# Patient Record
Sex: Male | Born: 2000 | Race: Black or African American | Hispanic: No | Marital: Single | Smoking: Never smoker
Health system: Southern US, Community
[De-identification: ages and names within clinical notes are randomized; demographics above are authoritative.]

## PROBLEM LIST (undated history)

## (undated) DIAGNOSIS — I4891 Unspecified atrial fibrillation: Secondary | ICD-10-CM

## (undated) DIAGNOSIS — Q201 Double outlet right ventricle: Secondary | ICD-10-CM

## (undated) DIAGNOSIS — Q203 Discordant ventriculoarterial connection: Secondary | ICD-10-CM

## (undated) DIAGNOSIS — Q210 Ventricular septal defect: Secondary | ICD-10-CM

## (undated) HISTORY — PX: EYE SURGERY: SHX253

## (undated) HISTORY — PX: CARDIAC CATHETERIZATION: SHX172

---
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
## 2022-04-16 IMAGING — DX DG CHEST 1V PORT
1 series · 1 of 1 positions shown · non-contrast
Comparison: None.

CLINICAL DATA: Pain.

EXAM:
PORTABLE CHEST 1 VIEW

[chest ap]
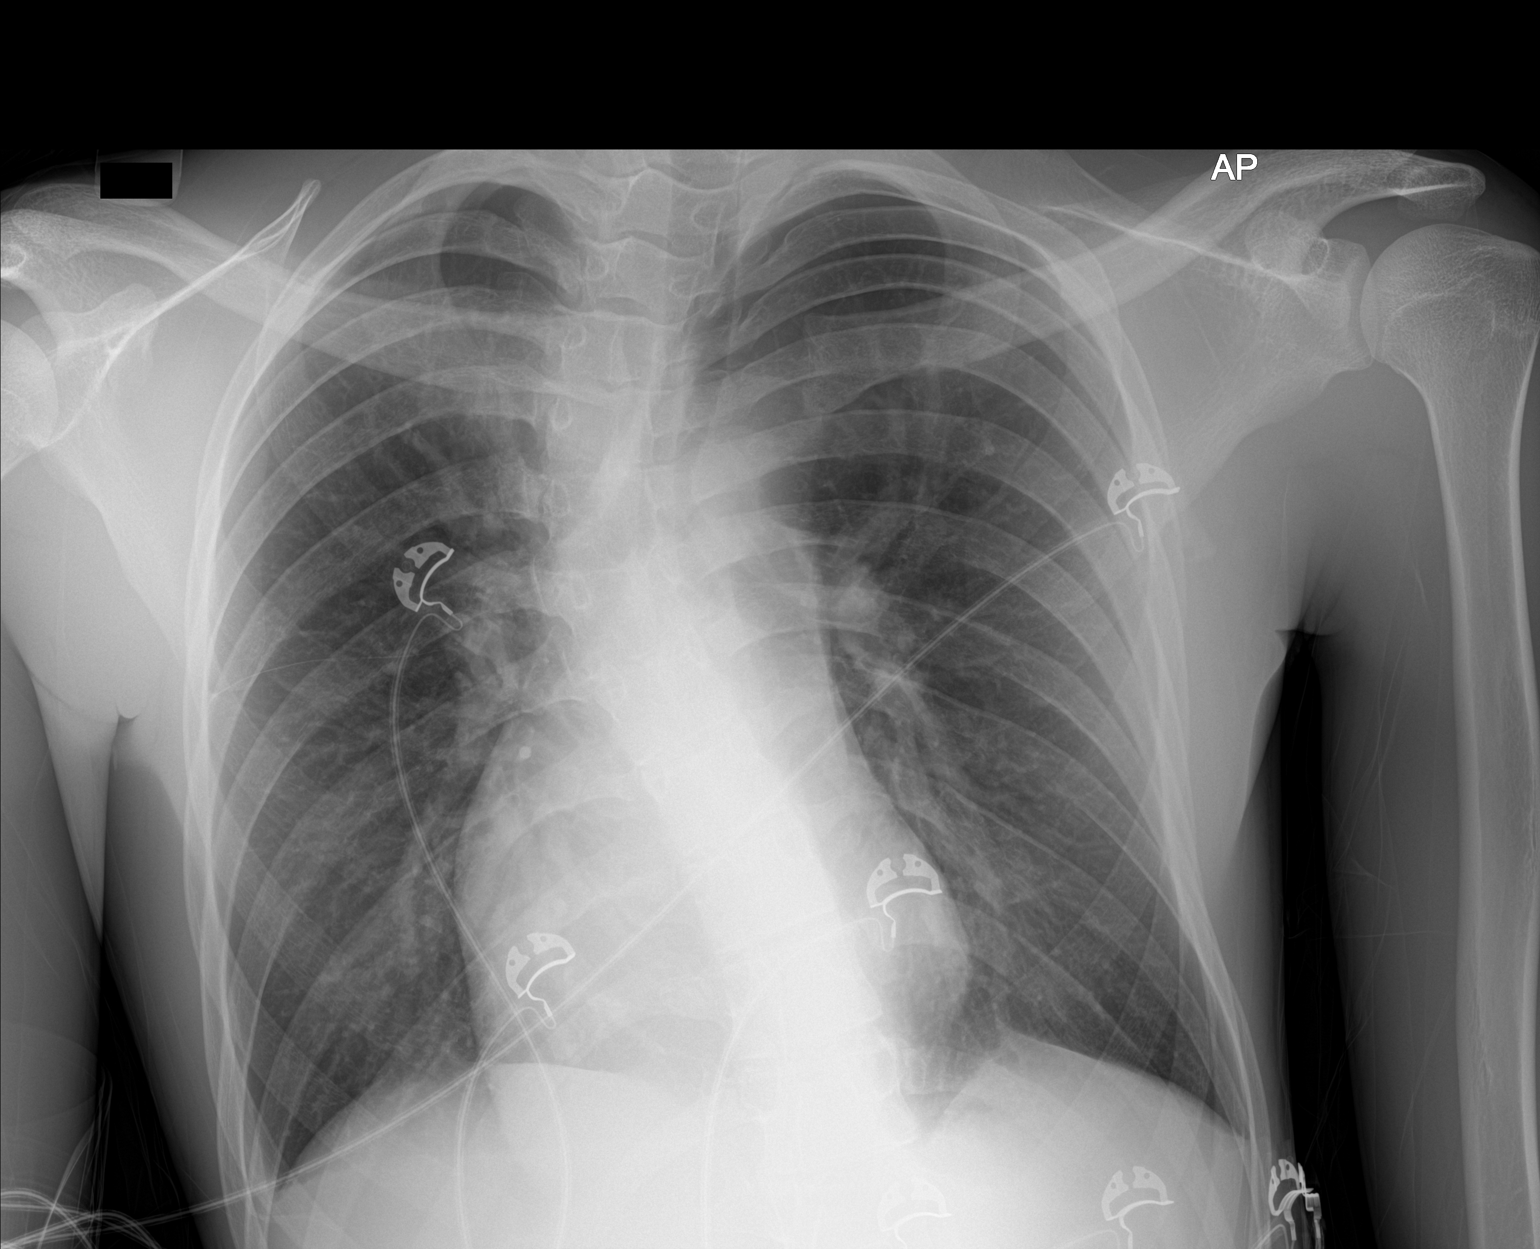

[1 of 1 positions shown; findings below may reference images not displayed]

FINDINGS: The cardiomediastinal contours are normal, partially obscured by
prominent scoliosis. The lungs are clear. Pulmonary vasculature is
normal. No consolidation, pleural effusion, or pneumothorax.
Scoliotic curvature of the spine. No acute osseous abnormalities are
seen.
IMPRESSION: 1. No acute chest findings.
2. Scoliosis.

## 2022-05-22 ENCOUNTER — Encounter (HOSPITAL_BASED_OUTPATIENT_CLINIC_OR_DEPARTMENT_OTHER): Payer: Self-pay

## 2022-05-22 ENCOUNTER — Other Ambulatory Visit: Payer: Self-pay

## 2022-05-22 ENCOUNTER — Emergency Department (HOSPITAL_BASED_OUTPATIENT_CLINIC_OR_DEPARTMENT_OTHER)
Admission: EM | Admit: 2022-05-22 | Discharge: 2022-05-22 | Disposition: A | Discharge status: Home / Self Care | Payer: Medicaid Other | Attending: Emergency Medicine | Admitting: Emergency Medicine

## 2022-05-22 DIAGNOSIS — Z202 Contact with and (suspected) exposure to infections with a predominantly sexual mode of transmission: Secondary | ICD-10-CM | POA: Insufficient documentation

## 2022-05-22 DIAGNOSIS — Z711 Person with feared health complaint in whom no diagnosis is made: Secondary | ICD-10-CM

## 2022-05-22 LAB — COMPREHENSIVE METABOLIC PANEL
ALT: 13 U/L (ref 0–44)
AST: 17 U/L (ref 15–41)
Albumin: 4.6 g/dL (ref 3.5–5.0)
Alkaline Phosphatase: 57 U/L (ref 38–126)
Anion gap: 7 (ref 5–15)
BUN: 10 mg/dL (ref 6–20)
CO2: 22 mmol/L (ref 22–32)
Calcium: 9.1 mg/dL (ref 8.9–10.3)
Chloride: 107 mmol/L (ref 98–111)
Creatinine, Ser: 0.69 mg/dL (ref 0.61–1.24)
GFR, Estimated: >60 mL/min (ref >60)
Glucose, Bld: 96 mg/dL (ref 70–99)
Potassium: 3.3 mmol/L — ABNORMAL LOW (ref 3.5–5.1)
Sodium: 136 mmol/L (ref 135–145)
Total Bilirubin: 0.9 mg/dL (ref 0.3–1.2)
Total Protein: 8 g/dL (ref 6.5–8.1)

## 2022-05-22 LAB — RAPID HIV SCREEN (HIV 1/2 AB+AG)
HIV 1/2 Antibodies: NONREACTIVE
HIV-1 P24 Antigen - HIV24: NONREACTIVE

## 2022-05-22 LAB — HEPATITIS C ANTIBODY: HCV Ab: NONREACTIVE

## 2022-05-22 LAB — HEPATITIS B SURFACE ANTIGEN: Hepatitis B Surface Ag: NONREACTIVE

## 2022-05-22 MED ORDER — EMTRICITABINE-TENOFOVIR DF 200-300 MG PO PREPACK
1.0000 | ORAL_TABLET | Freq: Once | ORAL | Status: DC
Start: 2022-05-22 — End: 2022-05-22

## 2022-05-22 MED ORDER — DOXYCYCLINE HYCLATE 100 MG PO CAPS: 100.0000 mg | ORAL_CAPSULE | Freq: Two times a day (BID) | ORAL | 0 refills | Status: AC

## 2022-05-22 MED ORDER — ONDANSETRON 4 MG PO TBDP: 4.0000 mg | ORAL_TABLET | Freq: Three times a day (TID) | ORAL | 0 refills | Status: DC | PRN

## 2022-05-22 MED ORDER — DOXYCYCLINE HYCLATE 100 MG PO TABS
100.0000 mg | ORAL_TABLET | Freq: Once | ORAL | Status: AC
  Administered 2022-05-22: 100 mg via ORAL
  Filled 2022-05-22: qty 1

## 2022-05-22 MED ORDER — EMTRICITABINE-TENOFOVIR DF 200-300 MG PO TABS
1.0000 | ORAL_TABLET | Freq: Every day | ORAL | 0 refills | Status: DC
Start: 2022-05-22 — End: 2022-06-08

## 2022-05-22 MED ORDER — CEFTRIAXONE SODIUM 500 MG IJ SOLR
500.0000 mg | Freq: Once | INTRAMUSCULAR | Status: AC
  Administered 2022-05-22: 500 mg via INTRAMUSCULAR
  Filled 2022-05-22: qty 500

## 2022-05-22 NOTE — Discharge Instructions (Addendum)
We are starting you on postexposure prophylaxis.  This medication may cause nausea/vomiting and other symptoms.  Please take as directed.  The results for some of your STD testing will come back in the MyChart app in the coming days.
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
## 2022-05-23 LAB — RPR: RPR Ser Ql: NONREACTIVE

## 2022-05-24 LAB — GC/CHLAMYDIA PROBE AMP (~~LOC~~) NOT AT ARMC
Chlamydia: NEGATIVE
Comment: NEGATIVE
Comment: NORMAL
Neisseria Gonorrhea: NEGATIVE

## 2022-06-05 ENCOUNTER — Emergency Department (HOSPITAL_BASED_OUTPATIENT_CLINIC_OR_DEPARTMENT_OTHER)
Admission: EM | Admit: 2022-06-05 | Discharge: 2022-06-06 | Disposition: A | Discharge status: Home / Self Care | Payer: Medicaid Other | Source: Home / Self Care | Attending: Emergency Medicine | Admitting: Emergency Medicine

## 2022-06-05 ENCOUNTER — Other Ambulatory Visit: Payer: Self-pay

## 2022-06-05 ENCOUNTER — Encounter (HOSPITAL_BASED_OUTPATIENT_CLINIC_OR_DEPARTMENT_OTHER): Payer: Self-pay | Admitting: Emergency Medicine

## 2022-06-05 DIAGNOSIS — Z7901 Long term (current) use of anticoagulants: Secondary | ICD-10-CM | POA: Insufficient documentation

## 2022-06-05 DIAGNOSIS — Z955 Presence of coronary angioplasty implant and graft: Secondary | ICD-10-CM | POA: Insufficient documentation

## 2022-06-05 DIAGNOSIS — I4891 Unspecified atrial fibrillation: Secondary | ICD-10-CM | POA: Insufficient documentation

## 2022-06-05 HISTORY — DX: Ventricular septal defect: Q21.0

## 2022-06-05 LAB — CBC WITH DIFFERENTIAL/PLATELET
Abs Immature Granulocytes: 0 10*3/uL (ref 0.00–0.07)
Basophils Absolute: 0 10*3/uL (ref 0.0–0.1)
Basophils Relative: 0 %
Eosinophils Absolute: 0.1 10*3/uL (ref 0.0–0.5)
Eosinophils Relative: 3 %
HCT: 37.6 % — ABNORMAL LOW (ref 39.0–52.0)
Hemoglobin: 12.5 g/dL — ABNORMAL LOW (ref 13.0–17.0)
Immature Granulocytes: 0 %
Lymphocytes Relative: 44 %
Lymphs Abs: 2.1 10*3/uL (ref 0.7–4.0)
MCH: 27.7 pg (ref 26.0–34.0)
MCHC: 33.2 g/dL (ref 30.0–36.0)
MCV: 83.4 fL (ref 80.0–100.0)
Monocytes Absolute: 0.4 10*3/uL (ref 0.1–1.0)
Monocytes Relative: 9 %
Neutro Abs: 2.1 10*3/uL (ref 1.7–7.7)
Neutrophils Relative %: 44 %
Platelets: 220 10*3/uL (ref 150–400)
RBC: 4.51 MIL/uL (ref 4.22–5.81)
RDW: 11.5 % (ref 11.5–15.5)
WBC: 4.7 10*3/uL (ref 4.0–10.5)
nRBC: 0 % (ref 0.0–0.2)

## 2022-06-05 LAB — BASIC METABOLIC PANEL
Anion gap: 6 (ref 5–15)
BUN: 12 mg/dL (ref 6–20)
CO2: 23 mmol/L (ref 22–32)
Calcium: 9.4 mg/dL (ref 8.9–10.3)
Chloride: 107 mmol/L (ref 98–111)
Creatinine, Ser: 0.7 mg/dL (ref 0.61–1.24)
GFR, Estimated: >60 mL/min (ref >60)
Glucose, Bld: 100 mg/dL — ABNORMAL HIGH (ref 70–99)
Potassium: 4 mmol/L (ref 3.5–5.1)
Sodium: 136 mmol/L (ref 135–145)

## 2022-06-05 MED ORDER — DILTIAZEM HCL 25 MG/5ML IV SOLN
20.0000 mg | Freq: Once | INTRAVENOUS | Status: AC
  Administered 2022-06-05: 20 mg via INTRAVENOUS
  Filled 2022-06-05: qty 5
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
## 2022-06-05 NOTE — ED Triage Notes (Signed)
Patient here with rapid HR.  Patient denies any chest pain, but does have some shortness of breath.

## 2022-06-06 ENCOUNTER — Encounter (HOSPITAL_BASED_OUTPATIENT_CLINIC_OR_DEPARTMENT_OTHER): Payer: Self-pay

## 2022-06-06 ENCOUNTER — Emergency Department (HOSPITAL_BASED_OUTPATIENT_CLINIC_OR_DEPARTMENT_OTHER): Payer: Medicaid Other

## 2022-06-06 ENCOUNTER — Inpatient Hospital Stay (HOSPITAL_BASED_OUTPATIENT_CLINIC_OR_DEPARTMENT_OTHER)
Admission: EM | Admit: 2022-06-06 | Discharge: 2022-06-08 | DRG: 309 | Disposition: A | Discharge status: Home / Self Care | Payer: Medicaid Other | Attending: Cardiovascular Disease | Admitting: Cardiovascular Disease

## 2022-06-06 DIAGNOSIS — Z79899 Other long term (current) drug therapy: Secondary | ICD-10-CM | POA: Diagnosis not present

## 2022-06-06 DIAGNOSIS — Q203 Discordant ventriculoarterial connection: Secondary | ICD-10-CM | POA: Diagnosis not present

## 2022-06-06 DIAGNOSIS — Q248 Other specified congenital malformations of heart: Secondary | ICD-10-CM

## 2022-06-06 DIAGNOSIS — Z91199 Patient's noncompliance with other medical treatment and regimen due to unspecified reason: Secondary | ICD-10-CM | POA: Diagnosis not present

## 2022-06-06 DIAGNOSIS — Q210 Ventricular septal defect: Secondary | ICD-10-CM | POA: Diagnosis not present

## 2022-06-06 DIAGNOSIS — I4819 Other persistent atrial fibrillation: Secondary | ICD-10-CM | POA: Diagnosis not present

## 2022-06-06 DIAGNOSIS — Q205 Discordant atrioventricular connection: Secondary | ICD-10-CM

## 2022-06-06 DIAGNOSIS — Z7901 Long term (current) use of anticoagulants: Secondary | ICD-10-CM

## 2022-06-06 DIAGNOSIS — I480 Paroxysmal atrial fibrillation: Principal | ICD-10-CM

## 2022-06-06 DIAGNOSIS — D6869 Other thrombophilia: Secondary | ICD-10-CM | POA: Diagnosis present

## 2022-06-06 DIAGNOSIS — Z8774 Personal history of (corrected) congenital malformations of heart and circulatory system: Secondary | ICD-10-CM | POA: Diagnosis not present

## 2022-06-06 DIAGNOSIS — I4891 Unspecified atrial fibrillation: Secondary | ICD-10-CM | POA: Diagnosis not present

## 2022-06-06 DIAGNOSIS — M419 Scoliosis, unspecified: Secondary | ICD-10-CM | POA: Diagnosis present

## 2022-06-06 LAB — D-DIMER, QUANTITATIVE: D-Dimer, Quant: <0.27 ug/mL-FEU (ref 0.00–0.50)

## 2022-06-06 LAB — CBC
HCT: 42.9 % (ref 39.0–52.0)
Hemoglobin: 14.1 g/dL (ref 13.0–17.0)
MCH: 27.6 pg (ref 26.0–34.0)
MCHC: 32.9 g/dL (ref 30.0–36.0)
MCV: 84.1 fL (ref 80.0–100.0)
Platelets: 267 10*3/uL (ref 150–400)
RBC: 5.1 MIL/uL (ref 4.22–5.81)
RDW: 11.6 % (ref 11.5–15.5)
WBC: 5.7 10*3/uL (ref 4.0–10.5)
nRBC: 0 % (ref 0.0–0.2)

## 2022-06-06 LAB — BASIC METABOLIC PANEL
Anion gap: 9 (ref 5–15)
BUN: 12 mg/dL (ref 6–20)
CO2: 23 mmol/L (ref 22–32)
Calcium: 9.5 mg/dL (ref 8.9–10.3)
Chloride: 105 mmol/L (ref 98–111)
Creatinine, Ser: 0.78 mg/dL (ref 0.61–1.24)
GFR, Estimated: >60 mL/min (ref >60)
Glucose, Bld: 104 mg/dL — ABNORMAL HIGH (ref 70–99)
Potassium: 4.2 mmol/L (ref 3.5–5.1)
Sodium: 137 mmol/L (ref 135–145)

## 2022-06-06 LAB — TROPONIN I (HIGH SENSITIVITY)
Troponin I (High Sensitivity): 5 ng/L (ref <18)
Troponin I (High Sensitivity): 5 ng/L (ref <18)

## 2022-06-06 LAB — BRAIN NATRIURETIC PEPTIDE: B Natriuretic Peptide: 281.9 pg/mL — ABNORMAL HIGH (ref 0.0–100.0)

## 2022-06-06 LAB — MAGNESIUM: Magnesium: 2.1 mg/dL (ref 1.7–2.4)

## 2022-06-06 MED ORDER — DILTIAZEM HCL-DEXTROSE 125-5 MG/125ML-% IV SOLN (PREMIX)
5.0000 mg/h | INTRAVENOUS | Status: DC
  Administered 2022-06-06 – 2022-06-07 (×2): 5 mg/h via INTRAVENOUS
  Filled 2022-06-06 (×3): qty 125

## 2022-06-06 MED ORDER — APIXABAN 5 MG PO TABS: 5.0000 mg | ORAL_TABLET | Freq: Two times a day (BID) | ORAL | Status: DC

## 2022-06-06 MED ORDER — ACETAMINOPHEN 325 MG PO TABS
650.0000 mg | ORAL_TABLET | ORAL | Status: DC | PRN
  Administered 2022-06-07: 650 mg via ORAL
  Filled 2022-06-06: qty 2

## 2022-06-06 MED ORDER — EMTRICITABINE-TENOFOVIR AF 200-25 MG PO TABS: 1.0000 | ORAL_TABLET | Freq: Every day | ORAL | Status: DC

## 2022-06-06 MED ORDER — DILTIAZEM HCL ER COATED BEADS 120 MG PO CP24
240.0000 mg | ORAL_CAPSULE | Freq: Once | ORAL | Status: AC
  Administered 2022-06-06: 240 mg via ORAL
  Filled 2022-06-06: qty 2

## 2022-06-06 MED ORDER — HEPARIN (PORCINE) 25000 UT/250ML-% IV SOLN
950.0000 [IU]/h | INTRAVENOUS | Status: DC
  Administered 2022-06-06: 950 [IU]/h via INTRAVENOUS
  Filled 2022-06-06: qty 250

## 2022-06-06 MED ORDER — ONDANSETRON HCL 4 MG/2ML IJ SOLN: 4.0000 mg | Freq: Four times a day (QID) | INTRAMUSCULAR | Status: DC | PRN

## 2022-06-06 MED ORDER — DILTIAZEM LOAD VIA INFUSION
10.0000 mg | Freq: Once | INTRAVENOUS | Status: AC
  Administered 2022-06-06: 10 mg via INTRAVENOUS
  Filled 2022-06-06: qty 10

## 2022-06-06 MED ORDER — SODIUM CHLORIDE 0.9 % IV SOLN
INTRAVENOUS | Status: DC | PRN
  Administered 2022-06-06: 10 mL/h via INTRAVENOUS

## 2022-06-06 NOTE — H&P (Addendum)
Cardiology Admission History and Physical   Patient ID: Joe Curtis MRN: [id number]; DOB: [birth year]   Admission date: 06/06/2022  PCP:  Patient, No Pcp Per   [facility] Providers Cardiologist:  Rollene Rotunda, MD   Chief Complaint:  Afib with RVR  Patient Profile:   Joe Curtis is a 22 y.o. 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  History of Present Illness:   Mr. Colvard with the above past medical history has been following with Dr. Elizebeth Brooking with [facility] pediatric cardiology.  In addition to the above, he has corrected strabismus, scoliosis, and marfanoid features.  Per prior notes, Marfan syndrome and aortopathy has been previously excluded - I do not see these results.  He was admitted 06/2020 from [facility] where he presented with palpitations found to be in rapid A-fib.  He was transferred to [facility] for possible TEE guided cardioversion.  Fortunately he converted to sinus rhythm and Cardizem gtt. was discontinued.  He was discharged on Eliquis and low-dose metoprolol tartrate.  We referred him to [facility] congenital heart disease department.  It does not appear that he followed up with our A-fib clinic as instructed after this discharge.  He was seen in a [facility] 02/21/22 for palpitations found to have Afib with RVR. He was treated with 15 mg IV cardizem and told to follow up with his primary cardiologist Dr. Theresia Majors, he had an appt the next day. On his way to that appt on 02/22/22, he felt heart racing and was directed to [facility] ER. Labs generally WNL. He was again treated with 15 mg IV cardizem and admission for tikosyn load was discussed, but he did not want to stay. He also had not taken eliquis for several months at that time. He was discharged with home medications 12.5 mg  lopressor BID and eliquis 5 mg BID.   He was seen in the ER 05/22/2022 for concern for STI. He was empirically treated with ABX and discharged without admission.     He presented back to the [facility] ER 06/05/22 with rapid heart rate in the setting of intermittent noncompliance with metoprolol and Eliquis.  He was given 20 mg IV Cardizem push as well as Cardizem CD 240 mg x 1 tablet.  The patient did not wish to be admitted and discharged from the ER with instructions to resume his home BB and eliquis.   Unfortunately he presented back to the ER today 06/06/2022 with A-fib RVR.  Cardiology was consulted and he was accepted for admission at [facility] for further management.  Of note he is apparently scheduled for VSD closure in July 2024.   It appears he sees Dr. Theresia Majors for cardiology.  During my interview, he reports trying to ride his bike yesterday when he felt rapid heart rate and chest pressure. He stopped riding his bike and proceeded to the ER yesterday, as above. Today, he woke up and took BB and eliquis, but again had heart racing associated with chest pressure and SOB.   HR now controlled on 5 mg/hr cardizem gtt.  HR in the 80-90s BP borderline 91/52 O2 sat 98% on room air  He appears asymptomatic with his BP. He feels a bit better in rate controlled Afib, but continues to report that he "feels it" and has chest pressure and SOB.  He routinely forgets to take morning medications. 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    Past Medical History:  Diagnosis Date   Dextratransposition of aorta    VSD (ventricular septal defect)     Past Surgical History:  Procedure Laterality Date   CARDIAC CATHETERIZATION     EYE SURGERY       Medications Prior to Admission: Prior to Admission medications   Medication Sig Start Date End Date Taking? Authorizing Provider  acetaminophen (TYLENOL) 500 MG tablet Take 500-1,000  mg by mouth every 8 (eight) hours as needed for pain.    [provider]  apixaban (ELIQUIS) 5 MG TABS tablet Take 1 tablet (5 mg total) by mouth 2 (two) times daily. 06/29/20   Duke, Roe Rutherford, PA  emtricitabine-tenofovir (TRUVADA) 200-300 MG tablet Take 1 tablet by mouth daily. 05/22/22   Long, Arlyss Repress, MD  metoprolol tartrate (LOPRESSOR) 25 MG tablet Take 0.5 tablets (12.5 mg total) by mouth 2 (two) times daily. 06/29/20 06/29/21  Duke, Roe Rutherford, PA  ondansetron (ZOFRAN-ODT) 4 MG disintegrating tablet Take 1 tablet (4 mg total) by mouth every 8 (eight) hours as needed. 05/22/22   Long, Arlyss Repress, MD     Allergies:   No Known Allergies  Social History:   Social History   Socioeconomic History   Marital status: Single    Spouse name: Not on file   Number of children: Not on file   Years of education: Not on file   Highest education level: Not on file  Occupational History   Not on file  Tobacco Use   Smoking status: Never   Smokeless tobacco: Never  Vaping Use   Vaping Use: Never used  Substance and Sexual Activity   Alcohol use: Never   Drug use: Yes    Frequency: 1.0 times per week    Types: Marijuana    Comment: Once or twice a week   Sexual activity: Not Currently  Other Topics Concern   Not on file  Social History Narrative   Not on file   Social Determinants of Health   Financial Resource Strain: Not on file  Food Insecurity: Not on file  Transportation Needs: Not on file  Physical Activity: Not on file  Stress: Not on file  Social Connections: Not on file  Intimate Partner Violence: Not on file    Family History:   The patient's family history is not on file.    ROS:  Please see the history of present illness.  All other ROS reviewed and negative.     Physical Exam/Data:   Vitals:   06/06/22 1145 06/06/22 1200 06/06/22 1207 06/06/22 1300  BP: 111/77 93/64 107/62 (!) 91/52  Pulse: 88 85 92   Resp: (!) 22 18 20 19   Temp:    98.3 F (36.8  C)  TempSrc:    Oral  SpO2: 98% 97% 98% 97%  Weight:    62.7 kg  Height:    6\' 1"  (1.854 m)   No intake or output data in the 24 hours ending 06/06/22 1352    06/06/2022    1:00 PM 06/06/2022   10:21 AM 05/22/2022    1:46 AM  Last 3 Weights  Weight (lbs) 138 lb 3.7 oz 140 lb 140 lb  Weight (kg) 62.7 kg 63.504 kg 63.504 kg     Body mass index is 18.24 kg/m.  General:  tall,  thin male in NAD HEENT: normal Neck: no JVD Vascular: No carotid bruits Cardiac:  irregular rhythm, regular rate, loud holosystolic murmur Lungs:  clear to auscultation bilaterally, no wheezing, rhonchi or rales  Abd: soft, nontender, no hepatomegaly  Ext: no edema Musculoskeletal:  No deformities, BUE and BLE strength normal and equal Skin: warm and dry  Neuro:  CNs 2-12 intact, no focal abnormalities noted Psych:  Normal affect    EKG:  The ECG that was done  was personally reviewed and demonstrates atrial fibrillation with ventricular rate 109, TWI I, II, V5-6  Relevant CV Studies:  Cardiac MR 12/2021: IMPRESSION:   Congenitally corrected TGA: (S, L, L-TGV). Associated small perimembranous ventricular septal defect (6 mm) with left to right shunting. No atrial septal defect.   No myocardial scarring/fibrosis.   The systemic ventricle (morphologic right ventricle) is dilated with a calculated ejection fraction of 53%. There is diffuse thickening/hypertrophy of the ventricular myocardium. There is no focal ventricular wall motion abnormality and no delayed myocardial gadolinium enhancement to suggest scarring or fibrosis. No right ventricular outflow obstruction.   The morphologic left ventricle is normal in size with a calculated ejection fraction of 62%.   Tricuspid regurgitation. Mitral valve prolapse with regurgitation. Pulmonary valve stenosis.   Normal sized ascending thoracic aorta measuring 3.6 cm. Dilated main pulmonary artery measuring 3.3 cm.    Echo 02/20/21: Summary:   1. Mild  tricuspid valve regurgitation.   2. Mild mitral valve insufficiency.   3. Mesocardia (heart located midline).   4. Normal left ventricular cavity size and systolic function.   5. Normal right ventricular cavity size and systolic function.   6. VSD Vmax = 5.3 m/sec.   7. Congenitally corrected transposition of the great arteries.   8. (Systemic) right ventricular systolic pressure estimate = 74.6 mmHg.   9. Small perimembranous outlet ventricular septal defect.  10. Ventriculoarterial connections not established on this study.  LV Diastolic Function:  E/A (mitral inflow):   1.88    Laboratory Data:  High Sensitivity Troponin:   Recent Labs  Lab 06/06/22 1052  TROPONINIHS 5      Chemistry Recent Labs  Lab 06/05/22 2255 06/06/22 1029 06/06/22 1052  NA 136 137  --   K 4.0 4.2  --   CL 107 105  --   CO2 23 23  --   GLUCOSE 100* 104*  --   BUN 12 12  --   CREATININE 0.70 0.78  --   CALCIUM 9.4 9.5  --   MG  --   --  2.1  GFRNONAA >60 >60  --   ANIONGAP 6 9  --     No results for input(s): "PROT", "ALBUMIN", "AST", "ALT", "ALKPHOS", "BILITOT" in the last 168 hours. Lipids No results for input(s): "CHOL", "TRIG", "HDL", "LABVLDL", "LDLCALC", "CHOLHDL" in the last 168 hours. Hematology Recent Labs  Lab 06/05/22 2255 06/06/22 1029  WBC 4.7 5.7  RBC 4.51 5.10  HGB 12.5* 14.1  HCT 37.6* 42.9  MCV 83.4 84.1  MCH 27.7 27.6  MCHC 33.2 32.9  RDW 11.5 11.6  PLT 220 267   Thyroid No results for input(s): "TSH", "FREET4" in the last 168 hours. BNP Recent Labs  Lab 06/06/22 1056  BNP 281.9*    DDimer No results for input(s): "DDIMER" in the last 168 hours.   Radiology/Studies:  DG Chest Port 1 View  Result Date: 06/06/2022 CLINICAL DATA:  Atrial fibrillation. History of dextro transposition of aorta and ventricular septal  defect. The patient was seen last night for atrial fibrillation as well. EXAM: PORTABLE CHEST 1 VIEW COMPARISON:  AP chest 06/27/2020. FINDINGS:  Cardiac silhouette is again normal size. Mediastinal contours are unchanged. Note is made of dextro-transposition of the great arteries. The trachea appears well aerated. The lungs are clear. No pleural effusion pneumothorax. Moderate dextrocurvature of the mid to upper thoracic spine and mild levocurvature of the thoracolumbar junction, similar to prior. IMPRESSION: 1. No acute cardiopulmonary process. 2. S-shaped scoliosis of the thoracolumbar spine, similar to prior. Electronically Signed   By: Neita Garnet M.D.   On: 06/06/2022 10:48     Assessment and Plan:   Atrial fibrillation with RVR - has had a history of PAF since 2022 - previously maintained on 12.5 mg lopressor BID, but he has issues with compliance with BID medications - rate controlled with IV cardizem, but with borderline SBP in the 90s - will try to avoid anti-arrhythmic given noncompliance with eliquis - could consider digoxin if rate control needed with hypotension - will plan for TEE-DCCV Tues/Wed depending on availability   Chest pressure, SOB - Suspect this is from his Afib despite rate control - given noncompliance with eliquis, will collect a D-dimer to rule out PE - O2 sat 98% on room air   Chronic anticoagulation - has trouble remembering BID medication - expressed importance of taking OAC x 4 weeks after DCCV - may ultimately need xarelto to help with compliance   VSD - scheduled for closure in July 2024 with [facility]   Transposition of great arteries -Congenitally corrected - follows with ACHD clinic    [facility] has been requested to perform a transesophageal echocardiogram on [facility] for Afib with RVR.  After careful review of history and examination, the risks and benefits of transesophageal echocardiogram have been explained including risks of esophageal damage, perforation (1:10,000 risk), bleeding, pharyngeal hematoma as well as other potential complications associated with  conscious sedation including aspiration, arrhythmia, respiratory failure and death. Alternatives to treatment were discussed, questions were answered. Patient is willing to proceed.      Risk Assessment/Risk Scores:    CHA2DS2-VASc Score = 0   This indicates a 0.2% annual risk of stroke. The patient's score is based upon: CHF History: 0 HTN History: 0 Diabetes History: 0 Stroke History: 0 Vascular Disease History: 0 Age Score: 0 Gender Score: 0       Severity of Illness: The appropriate patient status for this patient is INPATIENT. Inpatient status is judged to be reasonable and necessary in order to provide the required intensity of service to ensure the patient's safety. The patient's presenting symptoms, physical exam findings, and initial radiographic and laboratory data in the context of their chronic comorbidities is felt to place them at high risk for further clinical deterioration. Furthermore, it is not anticipated that the patient will be medically stable for discharge from the hospital within 2 midnights of admission.   * I certify that at the point of admission it is my clinical judgment that the patient will require inpatient hospital care spanning beyond 2 midnights from the point of admission due to high intensity of service, high risk for further deterioration and high frequency of surveillance required.*   For questions or updates, please contact [facility] Please consult [email or web address] for contact info under     Signed, Marcelino Duster, PA  06/06/2022 1:52 PM   I have seen and examined the patient along with Marcelino Duster,  PA.  I have reviewed the chart, notes and new data.  I agree with PA's note.  Key new complaints: He has a persistent mild tightness in his chest.  This is the way he always perceives as atrial fibrillation and is how he knows that the rhythm is abnormal.  He does not have palpitations.  He is breathing comfortably lying fully  supine in bed.  Has not been dizzy, lightheaded or experienced presyncope.  No shortness of breath or edema. Key examination changes: He settled down in the hospital bed now and his ventricular rate is 80-90 bpm with a systolic blood pressure around 100 mmHg.  Loud holosystolic murmur of VSD is heard.  No rubs or gallops. Key new findings / data: Reviewed his previous echocardiogram report and MRI report.  He had normal size and function of both his systemic (right) and pulmonary (left) ventricle.  He has a perimembranous VSD.  PLAN: Since he has not been perfectly compliant with anticoagulation we will plan a TEE guided cardioversion.  I think it is a great idea to switch him to Xarelto instead of Eliquis which will hopefully provide better compliance in this young gentleman.  He does not have a history of GI bleeding.  Similarly, can do once daily dosing of metoprolol succinate 25 mg. TEE will also give [place] an opportunity to measure his ascending aorta more accuracy.  Shared Decision Making/Informed Consent The risks [stroke, cardiac arrhythmias rarely resulting in the need for a temporary or permanent pacemaker, skin irritation or burns, esophageal damage, perforation (1:10,000 risk), bleeding, pharyngeal hematoma as well as other potential complications associated with conscious sedation including aspiration, arrhythmia, respiratory failure and death], benefits (treatment guidance, restoration of normal sinus rhythm, diagnostic support) and alternatives of a transesophageal echocardiogram guided cardioversion were discussed in detail with Mr. Levi and he is willing to proceed.   Thurmon Fair, MD, [facility] [phone] 06/06/2022, 2:13 PM

## 2022-06-06 NOTE — Progress Notes (Signed)
Pt complaining of 8/10 discomfort in chest and shortness of breath. Changed d-dimer lab to stat and notified MD

## 2022-06-06 NOTE — ED Provider Notes (Signed)
[facility] EMERGENCY DEPARTMENT AT [facility] Provider Note   CSN: [id number] Arrival date & time: 06/06/22  1016     History  Chief Complaint  Patient presents with   Atrial Fibrillation    Joe Curtis is a 22 y.o. male.  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   He is scheduled to have VSD closure in July.  He has not been completely adherent to his medication regimen.  His rate was controlled with 20 mg of IV Cardizem and he went home.  He took his Eliquis and metoprolol this morning.   He began to feel that heart was racing even faster this AM.  Feels sensation of shortness of breath, chest tightness associated with his rapid heart rate.  Reports that he believes he can tell when he is in atrial fibrillation.  He has not been cardioverted in the past and that his rhythm has converted with medication.  He had previously been admitted to the hospital with cardiology service with plan for cardioversion, however converted spontaneously while he was on the diltiazem drip.   Denies medication changes, smoking, alcohol or drug use.  Denies recent illness, including no cough, fever, abdominal pain, nausea, vomiting, diarrhea, black or bloody stools Home Medications Prior to Admission medications   Medication Sig Start Date End Date Taking? Authorizing Provider  apixaban (ELIQUIS) 5 MG TABS tablet Take 1 tablet (5 mg total) by mouth 2 (two) times daily. 06/29/20  Yes Duke, Roe Rutherford, PA  metoprolol tartrate (LOPRESSOR) 25 MG tablet Take 0.5 tablets (12.5 mg total) by mouth 2 (two) times daily. 06/29/20 06/06/22 Yes Duke, Roe Rutherford, PA  Multiple Vitamin (MULTIVITAMIN) tablet Take 1 tablet by mouth daily.   Yes  [provider]  emtricitabine-tenofovir (TRUVADA) 200-300 MG tablet Take 1 tablet by mouth daily. Patient not taking: Reported on 06/06/2022 05/22/22   Long, Arlyss Repress, MD  ondansetron (ZOFRAN-ODT) 4 MG disintegrating tablet Take 1 tablet (4 mg total) by mouth every 8 (eight) hours as needed. Patient not taking: Reported on 06/06/2022 05/22/22   Long, Arlyss Repress, MD      Allergies    Patient has no known allergies.    Review of Systems   Review of Systems  Physical Exam Updated Vital Signs BP 98/67 (BP Location: Right Arm)   Pulse 92   Temp 98.2 F (36.8 C) (Oral)   Resp 18   Ht 6\' 1"  (1.854 m)   Wt 62.7 kg   SpO2 98%   BMI 18.24 kg/m  Physical Exam Vitals and nursing note reviewed.  Constitutional:      General: He is not in acute distress.    Appearance: He is well-developed. He is not diaphoretic.  HENT:     Head: Normocephalic and atraumatic.  Eyes:     Conjunctiva/sclera: Conjunctivae normal.  Cardiovascular:     Rate and Rhythm: Tachycardia present. Rhythm irregular.     Pulses: Normal pulses.     Heart sounds: Normal heart sounds. No murmur heard.    No friction rub. No gallop.  Pulmonary:     Effort: Pulmonary effort is normal. No respiratory distress.     Breath sounds: Normal breath sounds. No wheezing or rales.  Abdominal:     General: There is  no distension.     Palpations: Abdomen is soft.     Tenderness: There is no abdominal tenderness. There is no guarding.  Musculoskeletal:     Cervical back: Normal range of motion.  Skin:    General: Skin is warm and dry.  Neurological:     Mental Status: He is alert and oriented to person, place, and time.     ED Results / Procedures / Treatments   Labs (all labs ordered are listed, but only abnormal results are displayed) Labs Reviewed  BASIC METABOLIC PANEL - Abnormal; Notable for the following components:      Result Value   Glucose, Bld 104 (*)    All other components within normal limits  BRAIN  NATRIURETIC PEPTIDE - Abnormal; Notable for the following components:   B Natriuretic Peptide 281.9 (*)    All other components within normal limits  CBC  MAGNESIUM  HEPARIN LEVEL (UNFRACTIONATED)  APTT  CBC  CBC WITH DIFFERENTIAL/PLATELET  BASIC METABOLIC PANEL  CBC  RAPID HIV SCREEN (HIV 1/2 AB+AG)  D-DIMER, QUANTITATIVE  TROPONIN I (HIGH SENSITIVITY)  TROPONIN I (HIGH SENSITIVITY)    EKG EKG Interpretation  Date/Time:  Sunday Jun 06 2022 10:24:16 EDT Ventricular Rate:  107 PR Interval:    QRS Duration: 107 QT Interval:  343 QTC Calculation: 458 R Axis:   41 Text Interpretation: Atrial fibrillation Repol abnrm suggests ischemia, anterolateral Since prior ECG, rate slightly decreased, no other significant change Confirmed by Icel Castles ([id number]) on 06/06/2022 10:29:27 AM  Radiology DG Chest Port 1 View  Result Date: 06/06/2022 CLINICAL DATA:  Atrial fibrillation. History of dextro transposition of aorta and ventricular septal defect. The patient was seen last night for atrial fibrillation as well. EXAM: PORTABLE CHEST 1 VIEW COMPARISON:  AP chest 06/27/2020. FINDINGS: Cardiac silhouette is again normal size. Mediastinal contours are unchanged. Note is made of dextro-transposition of the great arteries. The trachea appears well aerated. The lungs are clear. No pleural effusion pneumothorax. Moderate dextrocurvature of the mid to upper thoracic spine and mild levocurvature of the thoracolumbar junction, similar to prior. IMPRESSION: 1. No acute cardiopulmonary process. 2. S-shaped scoliosis of the thoracolumbar spine, similar to prior. Electronically Signed   By: Ronald  Viola M.D.   On: 06/06/2022 10:48    Procedures Procedures    Medications Ordered in ED Medications  diltiazem (CARDIZEM) 1 mg/mL load via infusion 10 mg (10 mg Intravenous Bolus from Bag 06/06/22 1108)    And  diltiazem (CARDIZEM) 125 mg in dextrose 5% 125 mL (1 mg/mL) infusion (5 mg/hr Intravenous New  Bag/Given 06/06/22 1108)  0.9 %  sodium chloride infusion (10 mL/hr Intravenous New Bag/Given 06/06/22 1105)  acetaminophen (TYLENOL) tablet 650 mg (has no administration in time range)  ondansetron (ZOFRAN) injection 4 mg (has no administration in time range)  heparin ADULT infusion 100 units/mL (25000 units/250mL) (has no administration in time range)    ED Course/ Medical Decision Making/ A&P                              22  year old male with 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  Presents again with atrial fibrillation with elevated rate, symptomatic atrial fibrillation with rate ranging from 100s to 120s.  EKG personally about interpreted by  me.  Labs completed and personally about interpreted by me show no significant electrolyte abnormalities, anemia, or leukocytosis.  Discussed options for treatment, including possible cardioversion with symptoms beginning last night and patient reporting that he can tell when he goes into atrial fibrillation, versus initiating medication for rate control and discussing inpatient cardioversion.  He prefers medication for rate control at this time, given his history of congenital heart disease, nonadherence to his anticoagulation, feel that this is a reasonable treatment.  Given he has symptomatic A-fib with rapid rate, will initiate diltiazem bolus and drip, and discuss with cardiology who has admitted him in the past.  Diltiazem drip initiated. Had eliquis this AM. Transferred fo [name] with Cardiology for continued care of atrial fibrillation with RVR.           Final Clinical Impression(s) / ED Diagnoses Final diagnoses:  Atrial fibrillation with RVR (HCC)  Chronic anticoagulation  Transposition of great arteries   Mesocardia  Paroxysmal A-fib [facility])    Rx / DC Orders ED Discharge Orders     None         Alvira Monday, MD 06/06/22 2125

## 2022-06-06 NOTE — ED Notes (Signed)
Given microwave meal and po fluids per request for something to eat

## 2022-06-06 NOTE — ED Notes (Signed)
Called [facility] for transport at 11:18.

## 2022-06-06 NOTE — Progress Notes (Signed)
ANTICOAGULATION CONSULT NOTE - Initial Consult  Pharmacy Consult for Heparin Indication: atrial fibrillation  No Known Allergies  Patient Measurements: Height: 6\' 1"  (185.4 cm) Weight: 62.7 kg (138 lb 3.7 oz) IBW/kg (Calculated) : 79.9 Heparin Dosing Weight: 62.7 kg  Vital Signs: Temp: 98.3 F (36.8 C) (05/26 1300) Temp Source: Oral (05/26 1300) BP: 91/52 (05/26 1300) Pulse Rate: 92 (05/26 1207)  Labs: Recent Labs    06/05/22 2255 06/06/22 1029 06/06/22 1052 06/06/22 1308  HGB 12.5* 14.1  --   --   HCT 37.6* 42.9  --   --   PLT 220 267  --   --   CREATININE 0.70 0.78  --   --   TROPONINIHS  --   --  5 5   Estimated Creatinine Clearance: 130.6 mL/min (by C-G formula based on SCr of 0.78 mg/dL).  Medical History: Past Medical History:  Diagnosis Date   Dextratransposition of aorta    VSD (ventricular septal defect)    Assessment: 22 yo male admitted for atrial fibrillation with RVR. Patient reports taking Eliquis PTA - states last dose was 06/06/22 at 0900. Pharmacy has been consulted to dose heparin. Cardiology planning for DCCV early next week.  Given recent DOAC use, will start heparin infusion without bolus. Will begin heparin infusion at time of next apixaban dose (2100 tonight) and will monitor heparin and aPTT levels until correlating.  Goal of Therapy:  Heparin level 0.3-0.7 units/ml aPTT 66-102 seconds Monitor platelets by anticoagulation protocol: Yes   Plan:  Start heparin infusion at 950 units/hour at 2100 on 06/06/22 Check 6-hour aPTT level with morning labs Monitor daily heparin and aPTT levels until correlating Monitor daily CBC and signs/symptoms of bleeding F/u plan for DCCV, transition back to PTA DOAC as able  Thank you for involving pharmacy in this patient's care.   Rockwell Alexandria, PharmD PGY1 Pharmacy Resident 06/06/2022 2:37 PM

## 2022-06-06 NOTE — ED Triage Notes (Signed)
Pt was seen last night here in the ED with Afib. States he came back today because it feels like it has gotten worse.

## 2022-06-07 DIAGNOSIS — Z7901 Long term (current) use of anticoagulants: Secondary | ICD-10-CM

## 2022-06-07 DIAGNOSIS — Q203 Discordant ventriculoarterial connection: Secondary | ICD-10-CM | POA: Diagnosis not present

## 2022-06-07 DIAGNOSIS — Q210 Ventricular septal defect: Secondary | ICD-10-CM

## 2022-06-07 DIAGNOSIS — I4891 Unspecified atrial fibrillation: Secondary | ICD-10-CM | POA: Diagnosis not present

## 2022-06-07 LAB — CBC WITH DIFFERENTIAL/PLATELET
Abs Immature Granulocytes: 0.01 10*3/uL (ref 0.00–0.07)
Basophils Absolute: 0 10*3/uL (ref 0.0–0.1)
Basophils Relative: 1 %
Eosinophils Absolute: 0.2 10*3/uL (ref 0.0–0.5)
Eosinophils Relative: 3 %
HCT: 41.1 % (ref 39.0–52.0)
Hemoglobin: 13.4 g/dL (ref 13.0–17.0)
Immature Granulocytes: 0 %
Lymphocytes Relative: 43 %
Lymphs Abs: 2.4 10*3/uL (ref 0.7–4.0)
MCH: 27.6 pg (ref 26.0–34.0)
MCHC: 32.6 g/dL (ref 30.0–36.0)
MCV: 84.6 fL (ref 80.0–100.0)
Monocytes Absolute: 0.5 10*3/uL (ref 0.1–1.0)
Monocytes Relative: 9 %
Neutro Abs: 2.5 10*3/uL (ref 1.7–7.7)
Neutrophils Relative %: 44 %
Platelets: 264 10*3/uL (ref 150–400)
RBC: 4.86 MIL/uL (ref 4.22–5.81)
RDW: 11.6 % (ref 11.5–15.5)
WBC: 5.6 10*3/uL (ref 4.0–10.5)
nRBC: 0 % (ref 0.0–0.2)

## 2022-06-07 LAB — BASIC METABOLIC PANEL
Anion gap: 8 (ref 5–15)
BUN: 18 mg/dL (ref 6–20)
CO2: 25 mmol/L (ref 22–32)
Calcium: 9 mg/dL (ref 8.9–10.3)
Chloride: 103 mmol/L (ref 98–111)
Creatinine, Ser: 1 mg/dL (ref 0.61–1.24)
GFR, Estimated: >60 mL/min (ref >60)
Glucose, Bld: 96 mg/dL (ref 70–99)
Potassium: 4.3 mmol/L (ref 3.5–5.1)
Sodium: 136 mmol/L (ref 135–145)

## 2022-06-07 LAB — RAPID HIV SCREEN (HIV 1/2 AB+AG)
HIV 1/2 Antibodies: NONREACTIVE
HIV-1 P24 Antigen - HIV24: NONREACTIVE

## 2022-06-07 LAB — APTT
aPTT: 64 seconds — ABNORMAL HIGH (ref 24–36)
aPTT: 91 seconds — ABNORMAL HIGH (ref 24–36)

## 2022-06-07 LAB — HEPARIN LEVEL (UNFRACTIONATED): Heparin Unfractionated: 0.75 IU/mL — ABNORMAL HIGH (ref 0.30–0.70)

## 2022-06-07 MED ORDER — DIGOXIN 0.25 MG/ML IJ SOLN
0.2500 mg | Freq: Three times a day (TID) | INTRAMUSCULAR | Status: DC
  Administered 2022-06-07: 0.25 mg via INTRAVENOUS
  Filled 2022-06-07 (×3): qty 1

## 2022-06-07 MED ORDER — APIXABAN 5 MG PO TABS
5.0000 mg | ORAL_TABLET | Freq: Two times a day (BID) | ORAL | Status: AC
  Administered 2022-06-07 – 2022-06-08 (×3): 5 mg via ORAL
  Filled 2022-06-07 (×3): qty 1

## 2022-06-07 MED ORDER — METOPROLOL TARTRATE 12.5 MG HALF TABLET
12.5000 mg | ORAL_TABLET | Freq: Two times a day (BID) | ORAL | Status: DC
  Administered 2022-06-07: 12.5 mg via ORAL
  Filled 2022-06-07: qty 1

## 2022-06-07 MED ORDER — SODIUM CHLORIDE 0.9 % IV SOLN: INTRAVENOUS | Status: DC

## 2022-06-07 NOTE — Progress Notes (Signed)
Rounding Note    Patient Name: Joe Curtis Date of Encounter: 06/07/2022  [facility] Cardiologist: Rollene Rotunda, MD   Subjective   BP 113/94. Cr 1.0.  Reports chest pain has improved, continues to feel short of breath  Inpatient Medications    Scheduled Meds:  Continuous Infusions:  sodium chloride 10 mL/hr at 06/07/22 0322   diltiazem (CARDIZEM) infusion 5 mg/hr (06/07/22 0322)   heparin 950 Units/hr (06/07/22 0322)   PRN Meds: sodium chloride, acetaminophen, ondansetron (ZOFRAN) IV   Vital Signs    Vitals:   06/06/22 1946 06/06/22 2339 06/07/22 0438 06/07/22 0619  BP: 98/67 108/62 (!) 113/94   Pulse:  99    Resp: 18 20 14    Temp: 98.2 F (36.8 C) 98.1 F (36.7 C)    TempSrc: Oral Oral    SpO2: 98% 99% 100%   Weight:    62.8 kg  Height:        Intake/Output Summary (Last 24 hours) at 06/07/2022 0950 Last data filed at 06/07/2022 0322 Gross per 24 hour  Intake 549.69 ml  Output --  Net 549.69 ml      06/07/2022    6:19 AM 06/06/2022    1:00 PM 06/06/2022   10:21 AM  Last 3 Weights  Weight (lbs) 138 lb 6.4 oz 138 lb 3.7 oz 140 lb  Weight (kg) 62.778 kg 62.7 kg 63.504 kg      Telemetry    Afib 90-100s, briefly up to 130s - Personally Reviewed  ECG    No new ECG - Personally Reviewed  Physical Exam   GEN: No acute distress.   Neck: No JVD Cardiac: irregular, tachycardic, no murmurs, rubs, or gallops.  Respiratory: Clear to auscultation bilaterally. GI: Soft, nontender, non-distended  MS: No edema; No deformity. Neuro:  Nonfocal  Psych: Normal affect   Labs    High Sensitivity Troponin:   Recent Labs  Lab 06/06/22 1052 06/06/22 1308  TROPONINIHS 5 5     Chemistry Recent Labs  Lab 06/05/22 2255 06/06/22 1029 06/06/22 1052 06/07/22 0233  NA 136 137  --  136  K 4.0 4.2  --  4.3  CL 107 105  --  103  CO2 23 23  --  25  GLUCOSE 100* 104*  --  96  BUN 12 12  --  18  CREATININE 0.70 0.78  --  1.00  CALCIUM 9.4  9.5  --  9.0  MG  --   --  2.1  --   GFRNONAA >60 >60  --  >60  ANIONGAP 6 9  --  8    Lipids No results for input(s): "CHOL", "TRIG", "HDL", "LABVLDL", "LDLCALC", "CHOLHDL" in the last 168 hours.  Hematology Recent Labs  Lab 06/05/22 2255 06/06/22 1029 06/07/22 0233  WBC 4.7 5.7 5.6  RBC 4.51 5.10 4.86  HGB 12.5* 14.1 13.4  HCT 37.6* 42.9 41.1  MCV 83.4 84.1 84.6  MCH 27.7 27.6 27.6  MCHC 33.2 32.9 32.6  RDW 11.5 11.6 11.6  PLT 220 267 264   Thyroid No results for input(s): "TSH", "FREET4" in the last 168 hours.  BNP Recent Labs  Lab 06/06/22 1056  BNP 281.9*    DDimer  Recent Labs  Lab 06/06/22 2128  DDIMER <0.27     Radiology    DG Chest Port 1 View  Result Date: 06/06/2022 CLINICAL DATA:  Atrial fibrillation. History of dextro transposition of aorta and ventricular septal defect. The patient was seen  last night for atrial fibrillation as well. EXAM: PORTABLE CHEST 1 VIEW COMPARISON:  AP chest 06/27/2020. FINDINGS: Cardiac silhouette is again normal size. Mediastinal contours are unchanged. Note is made of dextro-transposition of the great arteries. The trachea appears well aerated. The lungs are clear. No pleural effusion pneumothorax. Moderate dextrocurvature of the mid to upper thoracic spine and mild levocurvature of the thoracolumbar junction, similar to prior. IMPRESSION: 1. No acute cardiopulmonary process. 2. S-shaped scoliosis of the thoracolumbar spine, similar to prior. Electronically Signed   By: Neita Garnet M.D.   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   Assessment & Plan    Atrial fibrillation with RVR: has had a history of PAF since 2022.  He has been on lopressor 12.5 mg BID and Eliquis 5mg  BID but reports  intermittent noncompliance with both.   -rates appear controlled with IV cardizem -check echo -Currently on IV heparin.  Will switch to PO eliquis with plan for TEE-DCCV tomorrow if remains in Afib.   -will plan for TEE-DCCV as remains symptomatic despite rate control.  After careful review of history and examination, the risks and benefits of transesophageal echocardiogram have been explained including risks of esophageal damage, perforation (1:10,000 risk), bleeding, pharyngeal hematoma as well as other potential complications associated with conscious sedation including aspiration, arrhythmia, respiratory failure and death. Alternatives to treatment were discussed, questions were answered. Patient is willing to proceed.     Chest pressure, SOB - Suspect this is from his Afib despite rate control - given noncompliance with eliquis, D-dimer checked and was negative - O2 sat 98% on room air     Chronic anticoagulation - has trouble remembering BID medication - expressed importance of taking OAC x 4 weeks after DCCV - may ultimately need xarelto to help with compliance     VSD - scheduled for closure in July 2024 with [facility]     Transposition of great arteries -Congenitally corrected - follows with [facility] clinic  For questions or updates, please contact [facility] Please consult [email or web address] for contact info under        Signed, Little Ishikawa, MD  06/07/2022, 9:50 AM

## 2022-06-07 NOTE — Progress Notes (Addendum)
ANTICOAGULATION CONSULT NOTE - Follow Up Consult  Pharmacy Consult for heparin Indication: atrial fibrillation  Labs: Recent Labs    06/05/22 2255 06/06/22 1029 06/06/22 1052 06/06/22 1308 06/07/22 0233  HGB 12.5* 14.1  --   --  13.4  HCT 37.6* 42.9  --   --  41.1  PLT 220 267  --   --  264  APTT  --   --   --   --  64*  HEPARINUNFRC  --   --   --   --  0.75*  CREATININE 0.70 0.78  --   --   --   TROPONINIHS  --   --  5 5  --     Assessment/Plan:  22yo male slightly subtherapeutic on heparin with initial dosing while DOAC on hold for upcoming DCCV but lab was drawn early and heparin likely still accumulating. Will continue infusion at current rate of 950 units/hr and check additional PTT.  Vernard Gambles, PharmD, BCPS 06/07/2022 3:30 AM

## 2022-06-07 NOTE — Progress Notes (Addendum)
ANTICOAGULATION CONSULT NOTE - Follow-up Consult  Pharmacy Consult for Heparin Indication: atrial fibrillation  No Known Allergies  Patient Measurements: Height: 6\' 1"  (185.4 cm) Weight: 62.8 kg (138 lb 6.4 oz) IBW/kg (Calculated) : 79.9 Heparin Dosing Weight: 62.7 kg  Vital Signs: Temp: 98.1 F (36.7 C) (05/26 2339) Temp Source: Oral (05/26 2339) BP: 113/94 (05/27 0438) Pulse Rate: 99 (05/26 2339)  Labs: Recent Labs    06/05/22 2255 06/06/22 1029 06/06/22 1052 06/06/22 1308 06/07/22 0233 06/07/22 0713  HGB 12.5* 14.1  --   --  13.4  --   HCT 37.6* 42.9  --   --  41.1  --   PLT 220 267  --   --  264  --   APTT  --   --   --   --  64* 91*  HEPARINUNFRC  --   --   --   --  0.75*  --   CREATININE 0.70 0.78  --   --  1.00  --   TROPONINIHS  --   --  5 5  --   --     Estimated Creatinine Clearance: 104.7 mL/min (by C-G formula based on SCr of 1 mg/dL).  Medical History: Past Medical History:  Diagnosis Date   Dextratransposition of aorta    VSD (ventricular septal defect)    Assessment: 22 yo male admitted for atrial fibrillation with RVR. Patient reports taking Eliquis PTA - states last dose was 06/06/22 at 0900. Pharmacy has been consulted to dose heparin. Cardiology planning for DCCV this week.  Heparin level expectedly supratherapeutic at 0.75 given recent DOAC use. aPTT this morning is therapeutic at 91 seconds on 950 units/hour. CBC stable. No issues with infusion reported, no signs/symptoms of bleeding noted. Will continue to monitor daily heparin and aPTT levels until they are correlating.   Goal of Therapy:  Heparin level 0.3-0.7 units/ml aPTT 66-102 seconds Monitor platelets by anticoagulation protocol: Yes   Plan:  Continue heparin infusion at 950 units/hour Check confirmatory 6-hour aPTT this afternoon Monitor daily heparin and aPTT levels until correlating Monitor daily CBC and signs/symptoms of bleeding F/u plans for DCCV and transition back to PTA  apixaban  Thank you for involving pharmacy in this patient's care.   Rockwell Alexandria, PharmD PGY1 Pharmacy Resident 06/07/2022 8:02 AM _________________________________________________________________________________________ 06/07/22 1015 Update  Cardiology would like to transition back to Eliquis this morning so patient will receive two doses today and one dose tomorrow morning before DCCV.   Plan: Stop heparin infusion Start apixaban 5 mg twice daily Monitor CBC and signs/symptoms of bleeding  Thank you for involving pharmacy in this patient's care.   Rockwell Alexandria, PharmD PGY1 Pharmacy Resident 06/07/2022 10:14 AM

## 2022-06-07 NOTE — Progress Notes (Signed)
   Called by RN. Pt's HR is high, in the 170s at times. BP is soft. Systolic BP is in the 90s at times. He is only on Diltiazem drip 5 mg/hr. He is symptomatic with palpitations.  Reviewed admit notes. Dr. Royann Shivers mentioned Dig if HRs hard to control as an option. PLAN: Start Digoxin IV 250 mcg every 8 hours x 3 doses Hold for HR < 100 Daily oral dose can be decided on rounds in AM [place], PA-C    06/07/2022 3:29 PM

## 2022-06-08 ENCOUNTER — Encounter (HOSPITAL_COMMUNITY): Payer: Self-pay | Admitting: Certified Registered"

## 2022-06-08 ENCOUNTER — Other Ambulatory Visit (HOSPITAL_COMMUNITY): Payer: Medicaid Other

## 2022-06-08 ENCOUNTER — Other Ambulatory Visit (HOSPITAL_COMMUNITY): Payer: Self-pay

## 2022-06-08 DIAGNOSIS — I4891 Unspecified atrial fibrillation: Secondary | ICD-10-CM | POA: Diagnosis not present

## 2022-06-08 LAB — BASIC METABOLIC PANEL
Anion gap: 7 (ref 5–15)
BUN: 14 mg/dL (ref 6–20)
CO2: 24 mmol/L (ref 22–32)
Calcium: 8.9 mg/dL (ref 8.9–10.3)
Chloride: 102 mmol/L (ref 98–111)
Creatinine, Ser: 0.82 mg/dL (ref 0.61–1.24)
GFR, Estimated: >60 mL/min (ref >60)
Glucose, Bld: 115 mg/dL — ABNORMAL HIGH (ref 70–99)
Potassium: 3.8 mmol/L (ref 3.5–5.1)
Sodium: 133 mmol/L — ABNORMAL LOW (ref 135–145)

## 2022-06-08 LAB — CBC
HCT: 37.3 % — ABNORMAL LOW (ref 39.0–52.0)
Hemoglobin: 12.3 g/dL — ABNORMAL LOW (ref 13.0–17.0)
MCH: 27.9 pg (ref 26.0–34.0)
MCHC: 33 g/dL (ref 30.0–36.0)
MCV: 84.6 fL (ref 80.0–100.0)
Platelets: 245 10*3/uL (ref 150–400)
RBC: 4.41 MIL/uL (ref 4.22–5.81)
RDW: 11.5 % (ref 11.5–15.5)
WBC: 5.7 10*3/uL (ref 4.0–10.5)
nRBC: 0 % (ref 0.0–0.2)

## 2022-06-08 LAB — PROTIME-INR
INR: 1.3 — ABNORMAL HIGH (ref 0.8–1.2)
Prothrombin Time: 16.5 seconds — ABNORMAL HIGH (ref 11.4–15.2)

## 2022-06-08 LAB — MAGNESIUM: Magnesium: 2 mg/dL (ref 1.7–2.4)

## 2022-06-08 SURGERY — ECHOCARDIOGRAM, TRANSESOPHAGEAL
Anesthesia: Monitor Anesthesia Care

## 2022-06-08 MED ORDER — METOPROLOL SUCCINATE ER 25 MG PO TB24: 25.0000 mg | ORAL_TABLET | Freq: Every day | ORAL | Status: DC

## 2022-06-08 MED ORDER — METOPROLOL SUCCINATE ER 25 MG PO TB24
25.0000 mg | ORAL_TABLET | Freq: Every evening | ORAL | 3 refills | Status: DC
  Filled 2022-06-08: qty 30, 30d supply, fill #0

## 2022-06-08 MED ORDER — RIVAROXABAN 20 MG PO TABS: 20.0000 mg | ORAL_TABLET | Freq: Every day | ORAL | Status: DC

## 2022-06-08 MED ORDER — METOPROLOL TARTRATE 12.5 MG HALF TABLET
12.5000 mg | ORAL_TABLET | Freq: Two times a day (BID) | ORAL | Status: AC
  Administered 2022-06-08: 12.5 mg via ORAL
  Filled 2022-06-08: qty 1

## 2022-06-08 MED ORDER — RIVAROXABAN 20 MG PO TABS
20.0000 mg | ORAL_TABLET | Freq: Every day | ORAL | 3 refills | Status: DC
  Filled 2022-06-08: qty 30, 30d supply, fill #0

## 2022-06-08 NOTE — Discharge Summary (Signed)
Discharge Summary    Patient ID: Joe Curtis MRN: [id number]; DOB: 01/15/2000  Admit date: 06/06/2022 Discharge date: 06/08/2022  PCP:  [facility]   [facility] Providers Cardiologist:  Rollene Rotunda, MD        Discharge Diagnoses    Principal Problem:   Atrial fibrillation with RVR [facility]) Active Problems:   VSD (ventricular septal defect)    Diagnostic Studies/Procedures    No studies this admission _____________   History of Present Illness     Joe Curtis is a 22 y.o. 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 Patient has been followed previously by Dr. Elizebeth Brooking with [facility] pediatric cardiology, more recently Dr. Jeanett Schlein.Patient admitted 06/2020 as a transfer from [facility] where he presented with palpitations found to be in rapid A-fib. He was transferred to [facility] for possible TEE guided cardioversion. Fortunately he converted to sinus rhythm. He was discharged on Eliquis and low-dose metoprolol tartrate. We referred him to [facility] congenital heart disease department.   He was seen in a [facility] 02/21/22 for palpitations found to have Afib with RVR. He was treated with 15 mg IV cardizem and told to follow up with his primary cardiologist Dr. Theresia Majors, he had an appt the next day. On his way to that appt on 02/22/22, he felt heart racing and was directed to [facility] ER. Labs generally WNL. He was again treated with 15 mg IV cardizem and admission for tikosyn load was discussed, but he did not want to stay. He also had not taken eliquis for several months at that time. He was discharged with home medications 12.5 mg lopressor BID and eliquis 5 mg BID.   He presented back to the [facility] ER 06/05/22 with rapid heart rate in the setting of intermittent noncompliance with metoprolol and Eliquis.  Afib symptoms began after he road his bike. He was given 20 mg IV Cardizem push as well as Cardizem CD 240 mg x 1 tablet. The patient did not wish to be admitted and discharged from the ER with instructions to resume his home BB and eliquis. Unfortunately, patient had recurrence of palpitations and again presented to the ED on 5/26, found with afib/RVR.   Hospital Course     Consultants: n/a  Atrial fibrillation with rapid ventricular response Secondary hypercoagulable state  Patient with recurrent ED visits for afib with RVR and associated chest pressure over the last several months. Initially rate controlled this admission with cardizem. Plans were made to complete TEE/DCCV given mixed compliance with Eliquis. On the afternoon of 5/27, patient noted to have increased ventricular rates, up to 170s with soft BP. Patient was given IV digoxin for rate control but incidentally had spontaneous conversion to NSR at approximately 8:30pm. On day of discharge, patient feeling much better in sinus rhythm. Will consolidate Metoprolol to Toprol XL 25mg  QD and switch to Xarelto 20mg  nightly to assist with compliance going forward.  Chest pressure  Patient with resolution of symptoms with restoration of NSR.   VSD  Patient currently with closure planned for July of this year with [facility] (sees Dr. Theresia Majors).  Transposition of great arteries  Patient s/p congenital correction, followed by [facility] clinic.       Did the patient have an acute coronary syndrome (MI, NSTEMI, STEMI, etc) this admission?:  No  Did the patient have a percutaneous coronary intervention (stent / angioplasty)?:  No.          _____________  Discharge Vitals Blood pressure 106/69, pulse 63, temperature 97.8 F (36.6 C), temperature source Oral, resp. rate 18, height 6\' 1"  (1.854 m), weight 63.2 kg, SpO2 100 %.  Filed Weights   06/06/22 1300 06/07/22 0619 06/08/22 0404  Weight: 62.7 kg 62.8  kg 63.2 kg   Physical Exam Vitals reviewed.  Constitutional:      Appearance: Normal appearance.  Eyes:     Pupils: Pupils are equal, round, and reactive to light.  Cardiovascular:     Rate and Rhythm: Normal rate and regular rhythm.     Heart sounds: Murmur heard.  Pulmonary:     Effort: Pulmonary effort is normal.     Breath sounds: Normal breath sounds.  Abdominal:     General: Abdomen is flat.     Palpations: Abdomen is soft.  Musculoskeletal:        General: Normal range of motion.     Right lower leg: No edema.     Left lower leg: No edema.  Skin:    General: Skin is warm and dry.     Capillary Refill: Capillary refill takes less than 2 seconds.  Neurological:     General: No focal deficit present.     Mental Status: He is alert and oriented to person, place, and time.     Cranial Nerves: No cranial nerve deficit.  Psychiatric:        Mood and Affect: Mood normal.        Behavior: Behavior normal.        Thought Content: Thought content normal.        Judgment: Judgment normal.     Labs & Radiologic Studies    CBC Recent Labs    06/05/22 2255 06/06/22 1029 06/07/22 0233 06/08/22 0020  WBC 4.7   < > 5.6 5.7  NEUTROABS 2.1  --  2.5  --   HGB 12.5*   < > 13.4 12.3*  HCT 37.6*   < > 41.1 37.3*  MCV 83.4   < > 84.6 84.6  PLT 220   < > 264 245   < > = values in this interval not displayed.   Basic Metabolic Panel Recent Labs    16/10/96 1052 06/07/22 0233 06/08/22 0020  NA  --  136 133*  K  --  4.3 3.8  CL  --  103 102  CO2  --  25 24  GLUCOSE  --  96 115*  BUN  --  18 14  CREATININE  --  1.00 0.82  CALCIUM  --  9.0 8.9  MG 2.1  --  2.0   Liver Function Tests No results for input(s): "AST", "ALT", "ALKPHOS", "BILITOT", "PROT", "ALBUMIN" in the last 72 hours. No results for input(s): "LIPASE", "AMYLASE" in the last 72 hours. High Sensitivity Troponin:   Recent Labs  Lab 06/06/22 1052 06/06/22 1308  TROPONINIHS 5 5    BNP Invalid input(s):  "POCBNP" D-Dimer Recent Labs    06/06/22 2128  DDIMER <0.27   Hemoglobin A1C No results for input(s): "HGBA1C" in the last 72 hours. Fasting Lipid Panel No results for input(s): "CHOL", "HDL", "LDLCALC", "TRIG", "CHOLHDL", "LDLDIRECT" in the last 72 hours. Thyroid Function Tests No results for input(s): "TSH", "T4TOTAL", "T3FREE", "THYROIDAB" in the last 72 hours.  Invalid input(s): "FREET3" _____________  [facility]  Result Date: 06/06/2022 CLINICAL DATA:  Atrial fibrillation. History of dextro transposition of aorta and ventricular septal defect. The patient was seen last night for atrial fibrillation as well. EXAM: PORTABLE CHEST 1 VIEW COMPARISON:  AP chest 06/27/2020. FINDINGS: Cardiac silhouette is again normal size. Mediastinal contours are unchanged. Note is made of dextro-transposition of the great arteries. The trachea appears well aerated. The lungs are clear. No pleural effusion pneumothorax. Moderate dextrocurvature of the mid to upper thoracic spine and mild levocurvature of the thoracolumbar junction, similar to prior. IMPRESSION: 1. No acute cardiopulmonary process. 2. S-shaped scoliosis of the thoracolumbar spine, similar to prior. Electronically Signed   By: Neita Garnet M.D.   On: 06/06/2022 10:48   Disposition   Pt is being discharged home today in good condition.  Follow-up Plans & Appointments     Discharge Instructions     Diet - low sodium heart healthy   Complete by: As directed    Discharge instructions   Complete by: As directed    Please arrange follow up with your outpatient cardiologist in the next 2 weeks.   Increase activity slowly   Complete by: As directed         Discharge Medications   Allergies as of 06/08/2022   No Known Allergies      Medication List     STOP taking these medications    apixaban 5 MG Tabs tablet Commonly known as: ELIQUIS   emtricitabine-tenofovir 200-300 MG tablet Commonly known as: Truvada    metoprolol tartrate 25 MG tablet Commonly known as: LOPRESSOR   ondansetron 4 MG disintegrating tablet Commonly known as: ZOFRAN-ODT       TAKE these medications    metoprolol succinate 25 MG 24 hr tablet Commonly known as: TOPROL-XL Take 1 tablet (25 mg total) by mouth every evening.   multivitamin tablet Take 1 tablet by mouth daily.   rivaroxaban 20 MG Tabs tablet Commonly known as: XARELTO Take 1 tablet (20 mg total) by mouth daily with supper.           Outstanding Labs/Studies    Duration of Discharge Encounter   Greater than 30 minutes including physician time.  Con Memos, PA-C 06/08/2022, 9:54 AM

## 2022-06-08 NOTE — Progress Notes (Signed)
Pt called this RN to room at approximately 2030 to advise he can feel that he converted to sinus rhythm. EKG done to confirm. Md notified. Rcvd verbal order from MD to order home dose of Metoprolol. Gave 1st dose and stopped cardizem gtt per MD.

## 2022-06-08 NOTE — Discharge Instructions (Signed)

## 2022-06-08 NOTE — Plan of Care (Signed)

## 2022-08-14 ENCOUNTER — Emergency Department (HOSPITAL_BASED_OUTPATIENT_CLINIC_OR_DEPARTMENT_OTHER): Payer: Medicaid Other

## 2022-08-14 ENCOUNTER — Encounter (HOSPITAL_BASED_OUTPATIENT_CLINIC_OR_DEPARTMENT_OTHER): Payer: Self-pay

## 2022-08-14 ENCOUNTER — Inpatient Hospital Stay (HOSPITAL_BASED_OUTPATIENT_CLINIC_OR_DEPARTMENT_OTHER)
Admission: EM | Admit: 2022-08-14 | Discharge: 2022-08-18 | DRG: 309 | Disposition: A | Discharge status: Home / Self Care | Payer: Medicaid Other | Attending: Internal Medicine | Admitting: Internal Medicine

## 2022-08-14 ENCOUNTER — Other Ambulatory Visit: Payer: Self-pay

## 2022-08-14 DIAGNOSIS — F329 Major depressive disorder, single episode, unspecified: Secondary | ICD-10-CM | POA: Diagnosis present

## 2022-08-14 DIAGNOSIS — Z79899 Other long term (current) drug therapy: Secondary | ICD-10-CM

## 2022-08-14 DIAGNOSIS — Z791 Long term (current) use of non-steroidal anti-inflammatories (NSAID): Secondary | ICD-10-CM

## 2022-08-14 DIAGNOSIS — I480 Paroxysmal atrial fibrillation: Principal | ICD-10-CM | POA: Diagnosis present

## 2022-08-14 DIAGNOSIS — Z7901 Long term (current) use of anticoagulants: Secondary | ICD-10-CM

## 2022-08-14 DIAGNOSIS — I4891 Unspecified atrial fibrillation: Principal | ICD-10-CM | POA: Diagnosis present

## 2022-08-14 DIAGNOSIS — F32A Depression, unspecified: Secondary | ICD-10-CM

## 2022-08-14 DIAGNOSIS — Z538 Procedure and treatment not carried out for other reasons: Secondary | ICD-10-CM | POA: Diagnosis not present

## 2022-08-14 DIAGNOSIS — Q203 Discordant ventriculoarterial connection: Secondary | ICD-10-CM

## 2022-08-14 DIAGNOSIS — Q210 Ventricular septal defect: Secondary | ICD-10-CM

## 2022-08-14 DIAGNOSIS — R011 Cardiac murmur, unspecified: Secondary | ICD-10-CM | POA: Diagnosis present

## 2022-08-14 DIAGNOSIS — R45851 Suicidal ideations: Secondary | ICD-10-CM

## 2022-08-14 DIAGNOSIS — F4010 Social phobia, unspecified: Secondary | ICD-10-CM | POA: Diagnosis present

## 2022-08-14 DIAGNOSIS — Z9152 Personal history of nonsuicidal self-harm: Secondary | ICD-10-CM

## 2022-08-14 HISTORY — DX: Unspecified atrial fibrillation: I48.91

## 2022-08-14 LAB — BASIC METABOLIC PANEL
Anion gap: 11 (ref 5–15)
BUN: 15 mg/dL (ref 6–20)
CO2: 21 mmol/L — ABNORMAL LOW (ref 22–32)
Calcium: 9.4 mg/dL (ref 8.9–10.3)
Chloride: 104 mmol/L (ref 98–111)
Creatinine, Ser: 0.75 mg/dL (ref 0.61–1.24)
GFR, Estimated: >60 mL/min (ref >60)
Glucose, Bld: 94 mg/dL (ref 70–99)
Potassium: 3.8 mmol/L (ref 3.5–5.1)
Sodium: 136 mmol/L (ref 135–145)

## 2022-08-14 LAB — CBC
HCT: 39.7 % (ref 39.0–52.0)
Hemoglobin: 13.5 g/dL (ref 13.0–17.0)
MCH: 28.2 pg (ref 26.0–34.0)
MCHC: 34 g/dL (ref 30.0–36.0)
MCV: 83.1 fL (ref 80.0–100.0)
Platelets: 204 10*3/uL (ref 150–400)
RBC: 4.78 MIL/uL (ref 4.22–5.81)
RDW: 11.5 % (ref 11.5–15.5)
WBC: 4.8 10*3/uL (ref 4.0–10.5)
nRBC: 0 % (ref 0.0–0.2)

## 2022-08-14 LAB — MAGNESIUM: Magnesium: 1.9 mg/dL (ref 1.7–2.4)

## 2022-08-14 MED ORDER — SODIUM CHLORIDE 0.9 % IV BOLUS
500.0000 mL | Freq: Once | INTRAVENOUS | Status: AC
  Administered 2022-08-14: 500 mL via INTRAVENOUS

## 2022-08-14 MED ORDER — METOPROLOL TARTRATE 5 MG/5ML IV SOLN
5.0000 mg | Freq: Once | INTRAVENOUS | Status: AC
  Administered 2022-08-14: 5 mg via INTRAVENOUS
  Filled 2022-08-14: qty 5

## 2022-08-14 MED ORDER — DILTIAZEM LOAD VIA INFUSION
10.0000 mg | Freq: Once | INTRAVENOUS | Status: AC
  Administered 2022-08-15: 10 mg via INTRAVENOUS
  Filled 2022-08-14: qty 10

## 2022-08-14 MED ORDER — DILTIAZEM HCL-DEXTROSE 125-5 MG/125ML-% IV SOLN (PREMIX)
5.0000 mg/h | INTRAVENOUS | Status: DC
  Administered 2022-08-15: 5 mg/h via INTRAVENOUS
  Filled 2022-08-14: qty 125

## 2022-08-14 NOTE — ED Triage Notes (Signed)
Pt states that he has had palpitations since 8pm tonight. Has had 3 episodes of a-fib this year.

## 2022-08-15 ENCOUNTER — Encounter (HOSPITAL_BASED_OUTPATIENT_CLINIC_OR_DEPARTMENT_OTHER): Payer: Self-pay

## 2022-08-15 DIAGNOSIS — Z79899 Other long term (current) drug therapy: Secondary | ICD-10-CM | POA: Diagnosis not present

## 2022-08-15 DIAGNOSIS — I4891 Unspecified atrial fibrillation: Secondary | ICD-10-CM

## 2022-08-15 DIAGNOSIS — Z538 Procedure and treatment not carried out for other reasons: Secondary | ICD-10-CM | POA: Diagnosis not present

## 2022-08-15 DIAGNOSIS — Z791 Long term (current) use of non-steroidal anti-inflammatories (NSAID): Secondary | ICD-10-CM | POA: Diagnosis not present

## 2022-08-15 DIAGNOSIS — Z9152 Personal history of nonsuicidal self-harm: Secondary | ICD-10-CM | POA: Diagnosis not present

## 2022-08-15 DIAGNOSIS — Q210 Ventricular septal defect: Secondary | ICD-10-CM | POA: Diagnosis not present

## 2022-08-15 DIAGNOSIS — R45851 Suicidal ideations: Secondary | ICD-10-CM

## 2022-08-15 DIAGNOSIS — I480 Paroxysmal atrial fibrillation: Secondary | ICD-10-CM | POA: Diagnosis not present

## 2022-08-15 DIAGNOSIS — R011 Cardiac murmur, unspecified: Secondary | ICD-10-CM | POA: Diagnosis not present

## 2022-08-15 DIAGNOSIS — F329 Major depressive disorder, single episode, unspecified: Secondary | ICD-10-CM | POA: Diagnosis not present

## 2022-08-15 DIAGNOSIS — Z7901 Long term (current) use of anticoagulants: Secondary | ICD-10-CM | POA: Diagnosis not present

## 2022-08-15 DIAGNOSIS — F4010 Social phobia, unspecified: Secondary | ICD-10-CM | POA: Diagnosis not present

## 2022-08-15 MED ORDER — ACETAMINOPHEN 500 MG PO TABS
500.0000 mg | ORAL_TABLET | ORAL | Status: DC | PRN
  Administered 2022-08-15 – 2022-08-17 (×3): 500 mg via ORAL
  Filled 2022-08-15 (×4): qty 1

## 2022-08-15 MED ORDER — METOPROLOL TARTRATE 25 MG PO TABS: 25.0000 mg | ORAL_TABLET | Freq: Every day | ORAL | Status: DC

## 2022-08-15 MED ORDER — DILTIAZEM HCL ER COATED BEADS 120 MG PO CP24
120.0000 mg | ORAL_CAPSULE | Freq: Every day | ORAL | Status: DC
  Administered 2022-08-15 – 2022-08-16 (×2): 120 mg via ORAL
  Filled 2022-08-15 (×3): qty 1

## 2022-08-15 MED ORDER — MELOXICAM 7.5 MG PO TABS
15.0000 mg | ORAL_TABLET | Freq: Every day | ORAL | Status: DC
  Administered 2022-08-15 – 2022-08-18 (×4): 15 mg via ORAL
  Filled 2022-08-15 (×4): qty 2

## 2022-08-15 MED ORDER — METOPROLOL SUCCINATE ER 25 MG PO TB24
25.0000 mg | ORAL_TABLET | Freq: Every evening | ORAL | Status: DC
  Administered 2022-08-15: 25 mg via ORAL
  Filled 2022-08-15: qty 1

## 2022-08-15 MED ORDER — APIXABAN 5 MG PO TABS
5.0000 mg | ORAL_TABLET | Freq: Two times a day (BID) | ORAL | Status: DC
  Administered 2022-08-15 – 2022-08-18 (×7): 5 mg via ORAL
  Filled 2022-08-15 (×7): qty 1

## 2022-08-15 NOTE — Subjective & Objective (Addendum)
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  Found to be in A-fib with RVR with rate in the 140s.  No response to IV Lopressor push. Subsequently placed on Cardizem drip after which his rate improved to 70-90s but still in Afib

## 2022-08-15 NOTE — ED Notes (Signed)
[facility] called for transport. 

## 2022-08-15 NOTE — Consult Note (Signed)
Joe Curtis Psychiatry Consult Evaluation  Service Date: August 15, 2022 LOS:  LOS: 0 days    Primary Psychiatric Diagnoses  MDD, single episode, severe 2.  Social anxiety disorder   Assessment  Joe Curtis is a 22 y.o. male admitted medically for 08/14/2022 10:48 PM for Afib with RVR. 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 Psychiatry was consulted for suicidal ideations by Dr. Debby Bud.   His current presentation of several weeks of low mood, poor sleep, anhedonia, guilt, decreased energy is most consistent with a diagnosis of major depressive disorder.  He separately meets criteria for social anxiety disorder and likely PTSD (medical trauma).  Based on initial interview, I do not feel that he will require inpatient psychiatric hospitalization (seems to have been triggered by C-S SRS screening rather than current suicidal ideations).  He will require therapy especially leading into planned surgery at the end of the month and would likely benefit from medication to help with his depression and anxiety.  Declined medication for today due to a desire to speak to his medical team (which is reasonable).  Psychiatry will reevaluate in the morning.  Based on my evaluation the patient, I feel is appropriate to de-escalate to TeleSitter however there were many issues getting this order to go through.  Please see plan below for detailed recommendations.   Diagnoses:  Active Hospital problems: Principal Problem:   Atrial fibrillation with rapid ventricular response (HCC) Active Problems:   Depression with suicidal ideation     Plan   ## Psychiatric Medication Recommendations:  -- defer to tomorrow, likely sertraline (most safety data in cardiac pts)   ## Medical Decision Making Capacity:   Need to clarify - chart states pt has legal guardian   ## Further Work-up:  -- none currently    -- most recent EKG on 8/4 had QtC of 459 notable for afib   ## Disposition:  -- There are no current psychiatric contraindications to discharge at this time. Plan to set up followup resources tomorrow.   ## Behavioral / Environmental:  --   No specific recommendations at this time.     ## Safety and Observation Level:  - Based on my clinical evaluation, I estimate the patient to be at low risk of self harm in the current setting - At this time, we recommend a telesitter level of observation. This decision is based on my review of the chart including patient's history and current presentation, interview of the patient, mental status examination, and consideration of suicide risk including evaluating suicidal ideation, plan, intent, suicidal or self-harm behaviors, risk factors, and protective factors. This judgment is based on our ability to directly address suicide risk, implement suicide prevention strategies and develop a safety plan while the patient is in the clinical setting. Please contact our team if there is a concern that risk level has changed.  Suicide risk assessment  Patient has following modifiable risk factors for suicide: under treated depression  and lack of access to outpatient mental health resources, which we are addressing by likely prescribing zoloft tomorrow, providing resources for outpt therapy.   Patient has following non-modifiable or demographic risk factors for suicide: male gender and history of self harm behavior  Patient has the following protective factors against suicide: Supportive family and Supportive friends   Thank you for  this consult request. Recommendations have been communicated to the primary team.  We will continue to follow at this time.   Mellony Danziger A Lita Flynn  Psychiatric and Social History   Relevant Aspects of Hospital Course:   Admitted on 08/14/2022 for afib with rvr. They screened + on CSSRS.   Patient Report:  Patient seen in late a.m./early afternoon.  Has a friend by bedside he asks to stay in the room for support.  He views his sleep as his biggest problem.  Has been having worse and worse dreams leading into a planned cardiac surgery at the end of the month.  He has had several surgeries and doctor's visits over the course of his life and has significant fear around this.  He first experienced suicidal ideations several weeks ago and at one point put several pills of metoprolol in his mouth before spitting them out.  Other than this 1 event, has not had any specific plan and has no intent; thoughts are brief and occur every few days.  Has not had any thoughts in the hospital and does not feel that he requires a sitter to keep himself safe in the hospital.  He remains hopeful for the future and is open to treatment for his depression and anxiety including therapy and potentially medications.  He would like to talk to his medical doctor (Dr. Debby Bud).  About recommended sertraline prior to going on it.  Beyond this, he endorses current depression characterized by aforementioned difficulties with sleep, anhedonia, guilt (primarily over bullying his younger brother as a teenager), decreased energy.  Endorses some symptoms of panic (mostly when waking up from sleep) but mostly symptoms of social anxiety including avoiding groups of people, avoiding customers at work, etc.  No current or historic mania or psychotic symptoms.  Brief overview of cannabinoid effects on depression provided a psychoeducation.  Denies synthetic cannabinol use.  Friend Zollie Scale) has no immediate safety concerns and advocates for de-escalation of sitter, which she views as unnecessary and a "invasion of privacy".  Psych ROS:  See HPI.   Collateral information:  Spoke to friend [place]. Need to clarify LG status and potentially call mom.    Psychiatric History:  Information collected from pt, medical reccord  Prev Dx/Sx: non Current Psych Provider: none Home Meds (current): none Previous Med Trials: none Therapy: none  Prior Psych Hospitalization: no  Prior Self Harm: suicidal gesture w/ metoprolol, 1 remote episode of cutting Prior Violence: denied  Family Psych History: pt denied Family Hx suicide: no  Social History:  Educational Hx: grad high school Occupational Hx: retail at [employer] Hx: denied Living Situation: with mom and one of his brothers (not the 69 y/o he feels guilt over) Spiritual Hx: denied Access to weapons: no  Substance History Tobacco use: no Alcohol use: rarely Drug use: every day, in evenings.    Exam Findings   Psychiatric Specialty Exam:  Presentation  General Appearance: Appropriate for Environment  Eye Contact:Good  Speech:Clear and Coherent  Speech Volume:Normal  Handedness:Right   Mood and Affect  Mood:Depressed  Affect:Congruent (sluggish)   Thought Process  Thought Processes:Coherent; Goal Directed; Linear  Descriptions of Associations:Intact  Orientation:Full (Time, Place and Person)  Thought Content:-- (devoid of delusions, paranoia)  Hallucinations:Hallucinations: None  Ideas of Reference:None  Suicidal Thoughts:Suicidal Thoughts: No  Homicidal Thoughts:Homicidal Thoughts: No   Sensorium  Memory:Immediate Good; Recent Good; Remote Good  Judgment:Fair  Insight:Good   Executive Functions  Concentration:Good  Attention Span:Good  Recall:Good  Fund of  Knowledge:Fair  Language:Good   Psychomotor Activity  Psychomotor Activity:Psychomotor Activity: Normal   Assets  Assets:Desire for Improvement; Housing; Social Support; Resilience   Sleep  Sleep:Sleep: Fair    Physical Exam: Vital signs:  Temp:  [97.9 F (36.6 C)-98.2 F (36.8 C)] 97.9 F (36.6 C) (08/04 0908) Pulse Rate:  [29-135] 91 (08/04 1528) Resp:   [9-27] 18 (08/04 0908) BP: (93-126)/(57-88) 111/61 (08/04 1528) SpO2:  [96 %-100 %] 98 % (08/04 1528) Weight:  [63.5 kg] 63.5 kg (08/03 2242) Physical Exam Constitutional:      Comments: Thin   Eyes:     Conjunctiva/sclera: Conjunctivae normal.  Pulmonary:     Effort: Pulmonary effort is normal.     Blood pressure 111/61, pulse 91, temperature 97.9 F (36.6 C), temperature source Oral, resp. rate 18, height 6\' 1"  (1.854 m), weight 63.5 kg, SpO2 98%. Body mass index is 18.47 kg/m.   Other History   These have been pulled in through the EMR, reviewed, and updated if appropriate.   Family History:  The patient's family history is not on file.  Medical History: Past Medical History:  Diagnosis Date   A-fib [facility])    Dextratransposition of aorta    VSD (ventricular septal defect)     Surgical History: Past Surgical History:  Procedure Laterality Date   CARDIAC CATHETERIZATION     EYE SURGERY      Medications:   Current Facility-Administered Medications:    acetaminophen (TYLENOL) tablet 500 mg, 500 mg, Oral, Q4H PRN, Norins, Rosalyn Gess, MD, 500 mg at 08/15/22 1559   apixaban (ELIQUIS) tablet 5 mg, 5 mg, Oral, BID, Norins, Rosalyn Gess, MD, 5 mg at 08/15/22 1526   diltiazem (CARDIZEM CD) 24 hr capsule 120 mg, 120 mg, Oral, Daily, Norins, Rosalyn Gess, MD, 120 mg at 08/15/22 1527   [COMPLETED] diltiazem (CARDIZEM) 1 mg/mL load via infusion 10 mg, 10 mg, Intravenous, Once, 10 mg at 08/15/22 0007 **AND** diltiazem (CARDIZEM) 125 mg in dextrose 5% 125 mL (1 mg/mL) infusion, 5-15 mg/hr, Intravenous, Continuous, Long, Arlyss Repress, MD, Last Rate: 5 mL/hr at 08/15/22 0251, 5 mg/hr at 08/15/22 0251   meloxicam (MOBIC) tablet 15 mg, 15 mg, Oral, Q breakfast, Norins, Rosalyn Gess, MD, 15 mg at 08/15/22 1526   metoprolol succinate (TOPROL-XL) 24 hr tablet 25 mg, 25 mg, Oral, QPM, Norins, Rosalyn Gess, MD  Allergies: No Known Allergies

## 2022-08-15 NOTE — ED Notes (Signed)
BED REJECTED AT THIS TIME FROM [facility] at bedside, advised on bed rejection, [facility]link at cancel, patient placement called by carelink, [facility] [place] 6E currently cannot take any patients at this time d/t critical censes, possible to have another IP bed on unit within a couple of hours. Charge RN notified and pt notified of cancel bed, pt verbalized understanding.

## 2022-08-15 NOTE — ED Notes (Signed)
ED TO INPATIENT HANDOFF REPORT  ED Nurse Name and Phone #: [phone] [facility]  S Name/Age/Gender Joe Curtis 22 y.o. male Room/Bed: MH04/MH04  Code Status   Code Status: Prior  Home/SNF/Other Home Patient oriented to: self, place, time, and situation Is this baseline? Yes   Triage Complete: Triage complete  Chief Complaint Atrial fibrillation with rapid ventricular response (HCC) [I48.91]  Triage Note Pt states that he has had palpitations since 8pm tonight. Has had 3 episodes of a-fib this year.   Allergies No Known Allergies  Level of Care/Admitting Diagnosis ED Disposition     ED Disposition  Admit   Condition  --   Comment  Hospital Area: [facility] [[id number]]  Level of Care: Progressive [102]  Admit to Progressive based on following criteria: CARDIOVASCULAR & THORACIC of moderate stability with acute coronary syndrome symptoms/low risk myocardial infarction/hypertensive urgency/arrhythmias/heart failure potentially compromising stability and stable post cardiovascular intervention patients.  Interfacility transfer: Yes  May place patient in observation at [facility] or [facility] if equivalent level of care is available:: Yes  Covid Evaluation: Asymptomatic - no recent exposure (last 10 days) testing not required  Diagnosis: Atrial fibrillation with rapid ventricular response [facility]) [[id number]]  Admitting Physician: Magnus Ivan [[id number]]  Attending Physician: Maia Plan [[id number]]          B Medical/Surgery History Past Medical History:  Diagnosis Date   A-fib (HCC)    Dextratransposition of aorta    VSD (ventricular septal defect)    Past Surgical History:  Procedure Laterality Date   CARDIAC CATHETERIZATION     EYE SURGERY       A IV Location/Drains/Wounds Patient Lines/Drains/Airways Status     Active Line/Drains/Airways     Name Placement date Placement time Site Days   Peripheral IV 08/14/22 18 G  Anterior;Right Forearm 08/14/22  2255  Forearm  1   Peripheral IV 08/15/22 18 G Anterior;Left Forearm 08/15/22  0011  Forearm  less than 1            Intake/Output Last 24 hours  Intake/Output Summary (Last 24 hours) at 08/15/2022 0310 Last data filed at 08/15/2022 0122 Gross per 24 hour  Intake 500 ml  Output 700 ml  Net -200 ml    Labs/Imaging Results for orders placed or performed during the hospital encounter of 08/14/22 (from the past 48 hour(s))  CBC     Status: None   Collection Time: 08/14/22 11:00 PM  Result Value Ref Range   WBC 4.8 4.0 - 10.5 K/uL   RBC 4.78 4.22 - 5.81 MIL/uL   Hemoglobin 13.5 13.0 - 17.0 g/dL   HCT 52.8 41.3 - 24.4 %   MCV 83.1 80.0 - 100.0 fL   MCH 28.2 26.0 - 34.0 pg   MCHC 34.0 30.0 - 36.0 g/dL   RDW 01.0 27.2 - 53.6 %   Platelets 204 150 - 400 K/uL   nRBC 0.0 0.0 - 0.2 %    Comment: Performed at [facility], [address] [place], [place] [address]  Basic metabolic panel     Status: Abnormal   Collection Time: 08/14/22 11:01 PM  Result Value Ref Range   Sodium 136 135 - 145 mmol/L   Potassium 3.8 3.5 - 5.1 mmol/L   Chloride 104 98 - 111 mmol/L   CO2 21 (L) 22 - 32 mmol/L   Glucose, Bld 94 70 - 99 mg/dL    Comment: Glucose reference range applies only to  samples taken after fasting for at least 8 hours.   BUN 15 6 - 20 mg/dL   Creatinine, Ser 1.61 0.61 - 1.24 mg/dL   Calcium 9.4 8.9 - 09.6 mg/dL   GFR, Estimated >04 >54 mL/min    Comment: (NOTE) Calculated using the CKD-EPI Creatinine Equation (2021)    Anion gap 11 5 - 15    Comment: Performed at [facility], [address] [place], [place] [address]  Magnesium     Status: None   Collection Time: 08/14/22 11:19 PM  Result Value Ref Range   Magnesium 1.9 1.7 - 2.4 mg/dL    Comment: Performed at [facility], [address] [place], [place] [address]   DG Chest [place] 1 View  Result Date: 08/14/2022 CLINICAL DATA:  Atrial fibrillation,  palpitations. Dextro transposition of the great arteries and VSD. EXAM: PORTABLE CHEST 1 VIEW COMPARISON:  06/06/2022. FINDINGS: The heart size and mediastinal contours are stable. No consolidation, effusion, or pneumothorax. There is S shaped scoliosis of the thoracic spine. IMPRESSION: Stable chest with no acute cardiopulmonary process. Electronically Signed   By: Thornell Sartorius M.D.   On: 08/14/2022 23:28    Pending Labs Unresulted Labs (From admission, onward)    None       Vitals/Pain Today's Vitals   08/15/22 0130 08/15/22 0150 08/15/22 0249 08/15/22 0249  BP: 100/60     Pulse: 67     Resp: 18     Temp:   98.2 F (36.8 C)   TempSrc:   Oral   SpO2: 96%     Weight:      Height:      PainSc:  0-No pain  0-No pain    Isolation Precautions No active isolations  Medications Medications  diltiazem (CARDIZEM) 1 mg/mL load via infusion 10 mg (10 mg Intravenous Bolus from Bag 08/15/22 0007)    And  diltiazem (CARDIZEM) 125 mg in dextrose 5% 125 mL (1 mg/mL) infusion (5 mg/hr Intravenous Rate/Dose Verify 08/15/22 0251)  sodium chloride 0.9 % bolus 500 mL (0 mLs Intravenous Stopped 08/15/22 0005)  metoprolol tartrate (LOPRESSOR) injection 5 mg (5 mg Intravenous Given 08/14/22 2347)    Mobility walks     Focused Assessments Cardiac Assessment Handoff:  Cardiac Rhythm: Atrial fibrillation No results found for: "CKTOTAL", "CKMB", "CKMBINDEX", "TROPONINI" Lab Results  Component Value Date   DDIMER <0.27 06/06/2022   Does the Patient currently have chest pain? No    R Recommendations: See Admitting Provider Note  Additional Notes: A&O x4, NAD noted, pt c/o no pain or discomfort, Afib control rate at this time with diltiazem drip at 5mg /hr, pt did received at 10 mg bolus. 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 VSS at current, girlfriend at bedside.

## 2022-08-15 NOTE — ED Notes (Signed)
Pt eating food from [employer] that girlfriend brought in, NAD Noted.

## 2022-08-15 NOTE — Progress Notes (Signed)
Patient arrived to room in NAD, VS stable. Patient oriented to room and call bell in reach.

## 2022-08-15 NOTE — Assessment & Plan Note (Signed)
Patient does report that he is frequently depressed by his medical condition and other social factors. He has considered suicide to the point of having a plan but never executing. He doesn't talk about his feelings with anyone. He appears calm at interview and currently denies active suicide ideation  Plan Psychiatric consult called and pending  Sitter present  Will await Psych recommendations re: long term medication and/or counseling. SSRI would be a good choice.

## 2022-08-15 NOTE — ED Provider Notes (Signed)
Emergency Department Provider Note   I have reviewed the triage vital signs and the nursing notes.   HISTORY  Chief Complaint Palpitations   HPI Joe Curtis is a 22 y.o. male with PMH of VSD, dextro transposition of aorta, A-fib on Eliquis/Metoprolol presents to the emergency department with heart palpitations.  Symptoms began suddenly this evening.  Patient tells me is mostly compliant with his Eliquis although does miss doses occasionally.  His last missed dose was within the past 1 to 2 weeks. He is less compliant with Metoprolol. In addition to palpitations he has a central heavy sensation in the chest which also feels similar to prior A fib episodes. No syncope. No SOB. Denies drug use.   Past Medical History:  Diagnosis Date   A-fib [facility])    Dextratransposition of aorta    VSD (ventricular septal defect)     Review of Systems  Constitutional: No fever/chills Cardiovascular: Positive chest pain and palpitations.  Respiratory: Denies shortness of breath. Gastrointestinal: No abdominal pain.  No nausea, no vomiting.  No diarrhea.   Musculoskeletal: Negative for back pain. Skin: Negative for rash. Neurological: Negative for headaches.  ____________________________________________   PHYSICAL EXAM:  VITAL SIGNS: ED Triage Vitals  Encounter Vitals Group     BP 08/14/22 2245 126/80     Pulse Rate 08/14/22 2245 65     Resp 08/14/22 2245 16     Temp 08/14/22 2245 98.1 F (36.7 C)     Temp Source 08/14/22 2245 Oral     SpO2 08/14/22 2245 99 %     Weight 08/14/22 2242 140 lb (63.5 kg)     Height 08/14/22 2242 6\' 1"  (1.854 m)   Constitutional: Alert and oriented. Well appearing and in no acute distress. Eyes: Conjunctivae are normal.  Head: Atraumatic. Nose: No congestion/rhinnorhea. Mouth/Throat: Mucous membranes are moist.   Neck: No stridor.   Cardiovascular: Tachycardia. Irregular rhythm. Good peripheral circulation. Grossly normal heart sounds.    Respiratory: Normal respiratory effort.  No retractions. Lungs CTAB. Gastrointestinal: Soft and nontender. No distention.  Musculoskeletal: No lower extremity tenderness nor edema. No gross deformities of extremities. Neurologic:  Normal speech and language. No gross focal neurologic deficits are appreciated.  Skin:  Skin is warm, dry and intact. No rash noted.  ____________________________________________   LABS (all labs ordered are listed, but only abnormal results are displayed)  Labs Reviewed  BASIC METABOLIC PANEL - Abnormal; Notable for the following components:      Result Value   CO2 21 (*)    All other components within normal limits  CBC  MAGNESIUM   ____________________________________________  EKG   EKG Interpretation Date/Time:  Saturday August 14 2022 22:47:17 EDT Ventricular Rate:  122 PR Interval:    QRS Duration:  106 QT Interval:  322 QTC Calculation: 459 R Axis:   38  Text Interpretation: Atrial fibrillation Repol abnrm suggests ischemia, diffuse leads Confirmed by Alona Bene [phone]) on 08/14/2022 10:59:32 PM        ____________________________________________  RADIOLOGY  DG Chest Port 1 View  Result Date: 08/14/2022 CLINICAL DATA:  Atrial fibrillation, palpitations. Dextro transposition of the great arteries and VSD. EXAM: PORTABLE CHEST 1 VIEW COMPARISON:  06/06/2022. FINDINGS: The heart size and mediastinal contours are stable. No consolidation, effusion, or pneumothorax. There is S shaped scoliosis of the thoracic spine. IMPRESSION: Stable chest with no acute cardiopulmonary process. Electronically Signed   By: Thornell Sartorius M.D.   On: 08/14/2022 23:28    ____________________________________________  PROCEDURES  Procedure(s) performed:   Procedures  CRITICAL CARE Performed by: Maia Plan Total critical care time: 35 minutes Critical care time was exclusive of separately billable procedures and treating other patients. Critical  care was necessary to treat or prevent imminent or life-threatening deterioration. Critical care was time spent personally by me on the following activities: development of treatment plan with patient and/or surrogate as well as nursing, discussions with consultants, evaluation of patient's response to treatment, examination of patient, obtaining history from patient or surrogate, ordering and performing treatments and interventions, ordering and review of laboratory studies, ordering and review of radiographic studies, pulse oximetry and re-evaluation of patient's condition.  Alona Bene, MD Emergency Medicine  ____________________________________________   INITIAL IMPRESSION / ASSESSMENT AND PLAN / ED COURSE  Pertinent labs & imaging results that were available during my care of the patient were reviewed by me and considered in my medical decision making (see chart for details).   This patient is Presenting for Evaluation of CP, which does require a range of treatment options, and is a complaint that involves a high risk of morbidity and mortality.  The Differential Diagnoses includes but is not exclusive to acute coronary syndrome, aortic dissection, pulmonary embolism, cardiac tamponade, community-acquired pneumonia, pericarditis, musculoskeletal chest wall pain, etc.   Critical Interventions-    Medications  diltiazem (CARDIZEM) 1 mg/mL load via infusion 10 mg (10 mg Intravenous Bolus from Bag 08/15/22 0007)    And  diltiazem (CARDIZEM) 125 mg in dextrose 5% 125 mL (1 mg/mL) infusion (5 mg/hr Intravenous New Bag/Given 08/15/22 0007)  sodium chloride 0.9 % bolus 500 mL (0 mLs Intravenous Stopped 08/15/22 0005)  metoprolol tartrate (LOPRESSOR) injection 5 mg (5 mg Intravenous Given 08/14/22 2347)    Reassessment after intervention: symptoms improved with rate control.   I decided to review pertinent External Data, and in summary patient with prior ED visits and admission for similar.  Occasionally missing Eliquis does in the past as well.   Clinical Laboratory Tests Ordered, included basic metabolic panel shows no acute kidney injury.  Electrolytes normal.  Magnesium normal.  CBC without anemia.  Radiologic Tests Ordered, included CXR. I independently interpreted the images and agree with radiology interpretation.   Cardiac Monitor Tracing which shows A fib with RVR.    Social Determinants of Health Risk patient is a non-smoker.   Consult complete with [facility], Dr. Loney Loh. Plan for admit.   Medical Decision Making: Summary:  Patient presents to the emergency department with acute onset heart palpitations and chest discomfort.  He arrives in A-fib with RVR with a rate in the 130-140 range.  Patient is mostly compliant with his anticoagulation but does miss doses occasionally with the last missed dose within the past 2 weeks. Does not meet ED criteria for cardioversion. Will focus on rate control and reassess.   Reevaluation with update and discussion with patient.  No response to IV metoprolol push.  Diltiazem push and infusion started with good rate control and symptom reduction.  No longer having chest discomfort. Will admit with patient now requiring infusion but HR in the 70-80 range currently.    Patient's presentation is most consistent with acute presentation with potential threat to life or bodily function.   Disposition: admit  ____________________________________________  FINAL CLINICAL IMPRESSION(S) / ED DIAGNOSES  Final diagnoses:  Atrial fibrillation with RVR (HCC)    Note:  This document was prepared using Dragon voice recognition software and may include unintentional dictation errors.  Alona Bene,  MD, [facility] Emergency Medicine    Lilianah Buffin, Arlyss Repress, MD 08/15/22 [phone]

## 2022-08-15 NOTE — Consult Note (Signed)
Brief Psychiatry Consult Note  Saw pt this AM. He has no current suicidal thoughts (most recently a few days ago). He has no plans to act on these thoughts and is open to multiple levels of intervention; deferring antidepressants at this time given active Afib/physical sx. He has immediate hope for the future. I feel he is appropriate for a telesitter which will most likely be dc tomorrow. Full note will be done later today.   Pearla Mckinny A Delta Deshmukh

## 2022-08-15 NOTE — Plan of Care (Signed)

## 2022-08-15 NOTE — Progress Notes (Signed)
Plan of Care Note for accepted transfer   Patient: Joe Curtis MRN: [id number]   DOA: 08/14/2022  Facility requesting transfer: [facility] Requesting Provider: Dr. 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  Found to be in A-fib with RVR with rate in the 140s.  No response to IV Lopressor push.  It was felt that he was a poor candidate for cardioversion due to history of Eliquis noncompliance.  Subsequently placed on Cardizem drip after which his rate improved to 70-90s but still in Afib.  No significant lab abnormalities.  Chest x-ray negative for acute finding.  Plan of care: The patient is accepted for admission to Progressive unit, at [facility].  [facility] will assume care on arrival to accepting facility. Until arrival, care as per EDP. However, [facility] available 24/7 for questions and assistance.  Author: John Giovanni, MD 08/15/2022  Check [email or web address] for on-call coverage.  Nursing staff, Please call [facility] Admits & Consults System-Wide number on Amion as soon as patient's arrival, so appropriate admitting provider can evaluate the pt.

## 2022-08-15 NOTE — Assessment & Plan Note (Signed)
Patient with chronic a fib wit two prior episodes this year of RVR requiring in-patient care. He reports sudden on-set rapidly followed by symptoms. At exam rate controlled a. Fib noted. He reports that he does take his medications as prescribed.  Plan Continue Metoprolol XR daily  Add cardiazem CD 120 mg daily - d/c diltiazem infusion 2 hrs after first dose.  Will need outpatient followup

## 2022-08-15 NOTE — H&P (Signed)
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  Found to be in A-fib with RVR with rate in the 140s.  No response to IV Lopressor push. Subsequently placed on Cardizem drip after which his rate improved to 70-90s but still in Afib    ED Course: T 97.9  120/78  HR 135  RR 18. Thin young man in no distress who appears calm. Lab: Bmet nl, Mg 1.9.   Review of Systems:  Review of Systems  Constitutional: Negative.   HENT: Negative.    Eyes: Negative.   Respiratory: Negative.    Cardiovascular:  Positive for palpitations. Negative for chest pain, orthopnea and PND.  Gastrointestinal: Negative.   Genitourinary: Negative.   Musculoskeletal: Negative.   Skin: Negative.   Neurological: Negative.   Endo/Heme/Allergies: Negative.   Psychiatric/Behavioral:  Positive for depression and suicidal ideas. The patient is nervous/anxious.     Past Medical History:  Diagnosis Date   A-fib [facility])    Dextratransposition of aorta    VSD (ventricular septal defect)     Past Surgical History:  Procedure Laterality Date   CARDIAC CATHETERIZATION     EYE SURGERY      Soc Hx - single, lives at home with mother and 3 siblings. Did some college work. 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   reports that he has never smoked. He has never used smokeless tobacco. He reports current drug use. Frequency: 1.00 time per week. Drug: Marijuana. He reports that he does not drink alcohol.  No Known Allergies  History  reviewed. No pertinent family history.  Prior to Admission medications   Medication Sig Start Date End Date Taking? Authorizing Provider  apixaban (ELIQUIS) 5 MG TABS tablet Take 5 mg by mouth 2 (two) times daily.   Yes [provider]  metoprolol tartrate (LOPRESSOR) 25 MG tablet Take 25 mg by mouth daily.   Yes [provider]  Multiple Vitamin (MULTIVITAMIN) tablet Take 1 tablet by mouth daily.   Yes [provider]  meloxicam (MOBIC) 15 MG tablet Take 15 mg by mouth daily. Patient not taking: Reported on 08/15/2022    [provider]  metoprolol succinate (TOPROL-XL) 25 MG 24 hr tablet Take 1 tablet (25 mg total) by mouth every evening. Patient not taking: Reported on 08/15/2022 06/08/22   Perlie Gold, PA-C  rivaroxaban (XARELTO) 20 MG TABS tablet Take 1 tablet (20 mg total) by mouth daily with supper. Patient not taking: Reported on 08/15/2022 06/08/22   Perlie Gold, PA-C    Physical Exam: Vitals:   08/15/22 0700 08/15/22 0800 08/15/22 0908 08/15/22 1000  BP: 108/67  104/69 120/78  Pulse: 86 67 (!) 135   Resp: 16 14 18    Temp:   97.9 F (36.6 C)   TempSrc:   Oral   SpO2: 98% 99% 98% 96%  Weight:      Height:        Physical Exam Vitals and nursing note reviewed.  Constitutional:      General: He is not in acute distress.  Appearance: Normal appearance. He is normal weight. He is not ill-appearing.  HENT:     Head: Normocephalic and atraumatic.     Mouth/Throat:     Mouth: Mucous membranes are moist.     Pharynx: Oropharynx is clear. No oropharyngeal exudate.  Eyes:     Extraocular Movements: Extraocular movements intact.     Conjunctiva/sclera: Conjunctivae normal.     Pupils: Pupils are equal, round, and reactive to light.  Cardiovascular:     Rate and Rhythm: Tachycardia present. Rhythm irregular.     Pulses: Normal pulses.     Heart sounds: Murmur heard.     Comments: Holosystolic mm best at apex, prominent RSB. Pulmonary:      Effort: Pulmonary effort is normal.     Breath sounds: Normal breath sounds.  Abdominal:     General: Abdomen is flat. Bowel sounds are normal.     Palpations: Abdomen is soft.  Musculoskeletal:        General: Normal range of motion.     Cervical back: Normal range of motion and neck supple.  Skin:    General: Skin is warm and dry.  Neurological:     General: No focal deficit present.     Mental Status: He is alert and oriented to person, place, and time.     Cranial Nerves: No cranial nerve deficit.  Psychiatric:        Behavior: Behavior normal.     Comments: Admits to feelings of anxiety, especially about planned percutaneous repair of VSD. Denies active suicidal ideation at this time.       Labs on Admission: I have personally reviewed following labs and imaging studies  CBC: Recent Labs  Lab 08/14/22 2300  WBC 4.8  HGB 13.5  HCT 39.7  MCV 83.1  PLT 204   Basic Metabolic Panel: Recent Labs  Lab 08/14/22 2301 08/14/22 2319  NA 136  --   K 3.8  --   CL 104  --   CO2 21*  --   GLUCOSE 94  --   BUN 15  --   CREATININE 0.75  --   CALCIUM 9.4  --   MG  --  1.9   GFR: Estimated Creatinine Clearance: 132.3 mL/min (by C-G formula based on SCr of 0.75 mg/dL). Liver Function Tests: No results for input(s): "AST", "ALT", "ALKPHOS", "BILITOT", "PROT", "ALBUMIN" in the last 168 hours. No results for input(s): "LIPASE", "AMYLASE" in the last 168 hours. No results for input(s): "AMMONIA" in the last 168 hours. Coagulation Profile: No results for input(s): "INR", "PROTIME" in the last 168 hours. Cardiac Enzymes: No results for input(s): "CKTOTAL", "CKMB", "CKMBINDEX", "TROPONINI" in the last 168 hours. BNP (last 3 results) No results for input(s): "PROBNP" in the last 8760 hours. HbA1C: No results for input(s): "HGBA1C" in the last 72 hours. CBG: No results for input(s): "GLUCAP" in the last 168 hours. Lipid Profile: No results for input(s): "CHOL", "HDL",  "LDLCALC", "TRIG", "CHOLHDL", "LDLDIRECT" in the last 72 hours. Thyroid Function Tests: No results for input(s): "TSH", "T4TOTAL", "FREET4", "T3FREE", "THYROIDAB" in the last 72 hours. Anemia Panel: No results for input(s): "VITAMINB12", "FOLATE", "FERRITIN", "TIBC", "IRON", "RETICCTPCT" in the last 72 hours. Urine analysis: No results found for: "COLORURINE", "APPEARANCEUR", "LABSPEC", "PHURINE", "GLUCOSEU", "HGBUR", "BILIRUBINUR", "KETONESUR", "PROTEINUR", "UROBILINOGEN", "NITRITE", "LEUKOCYTESUR"  Radiological Exams on Admission: I have personally reviewed images DG Chest Port 1 View  Result Date: 08/14/2022 CLINICAL DATA:  Atrial fibrillation, palpitations. Dextro transposition of the great arteries and  VSD. EXAM: PORTABLE CHEST 1 VIEW COMPARISON:  06/06/2022. FINDINGS: The heart size and mediastinal contours are stable. No consolidation, effusion, or pneumothorax. There is S shaped scoliosis of the thoracic spine. IMPRESSION: Stable chest with no acute cardiopulmonary process. Electronically Signed   By: Thornell Sartorius M.D.   On: 08/14/2022 23:28    EKG: I have personally reviewed EKG: a fib at 122, diffuse ischemic changes  Assessment/Plan Principal Problem:   Atrial fibrillation with rapid ventricular response (HCC) Active Problems:   Depression with suicidal ideation    Assessment and Plan: * Atrial fibrillation with rapid ventricular response (HCC) Patient with chronic a fib wit two prior episodes this year of RVR requiring in-patient care. He reports sudden on-set rapidly followed by symptoms. At exam rate controlled a. Fib noted. He reports that he does take his medications as prescribed.  Plan Continue Metoprolol XR daily  Add cardiazem CD 120 mg daily - d/c diltiazem infusion 2 hrs after first dose.  Will need outpatient followup  Depression with suicidal ideation Patient does report that he is frequently depressed by his medical condition and other social factors. He has  considered suicide to the point of having a plan but never executing. He doesn't talk about his feelings with anyone. He appears calm at interview and currently denies active suicide ideation  Plan Psychiatric consult called and pending  Sitter present  Will await Psych recommendations re: long term medication and/or counseling. SSRI would be a good choice.        DVT prophylaxis: Lovenox Code Status: Full Code Family Communication: Spoke with [employer], mother. She agrees with treatment plan.  Disposition Plan: home 24 -48 hrs  Consults called: Psychiatry  Admission status: Observation, Telemetry bed   Illene Regulus, MD [facility] 08/15/2022, 2:54 PM

## 2022-08-15 NOTE — ED Notes (Signed)
[facility] has arrived for pt at this time

## 2022-08-16 ENCOUNTER — Inpatient Hospital Stay (HOSPITAL_COMMUNITY): Payer: Medicaid Other

## 2022-08-16 DIAGNOSIS — I4891 Unspecified atrial fibrillation: Secondary | ICD-10-CM

## 2022-08-16 DIAGNOSIS — R45851 Suicidal ideations: Secondary | ICD-10-CM | POA: Diagnosis present

## 2022-08-16 DIAGNOSIS — Z791 Long term (current) use of non-steroidal anti-inflammatories (NSAID): Secondary | ICD-10-CM | POA: Diagnosis not present

## 2022-08-16 DIAGNOSIS — R011 Cardiac murmur, unspecified: Secondary | ICD-10-CM | POA: Diagnosis present

## 2022-08-16 DIAGNOSIS — I480 Paroxysmal atrial fibrillation: Secondary | ICD-10-CM | POA: Diagnosis present

## 2022-08-16 DIAGNOSIS — Z538 Procedure and treatment not carried out for other reasons: Secondary | ICD-10-CM | POA: Diagnosis not present

## 2022-08-16 DIAGNOSIS — F32A Depression, unspecified: Secondary | ICD-10-CM

## 2022-08-16 DIAGNOSIS — F4010 Social phobia, unspecified: Secondary | ICD-10-CM | POA: Diagnosis present

## 2022-08-16 DIAGNOSIS — Z79899 Other long term (current) drug therapy: Secondary | ICD-10-CM | POA: Diagnosis not present

## 2022-08-16 DIAGNOSIS — Q210 Ventricular septal defect: Secondary | ICD-10-CM | POA: Diagnosis not present

## 2022-08-16 DIAGNOSIS — Z7901 Long term (current) use of anticoagulants: Secondary | ICD-10-CM | POA: Diagnosis not present

## 2022-08-16 DIAGNOSIS — Q203 Discordant ventriculoarterial connection: Secondary | ICD-10-CM | POA: Diagnosis not present

## 2022-08-16 DIAGNOSIS — Z9152 Personal history of nonsuicidal self-harm: Secondary | ICD-10-CM | POA: Diagnosis not present

## 2022-08-16 DIAGNOSIS — F329 Major depressive disorder, single episode, unspecified: Secondary | ICD-10-CM | POA: Diagnosis present

## 2022-08-16 LAB — ECHOCARDIOGRAM COMPLETE
Height: 73 in
Weight: 2240 oz

## 2022-08-16 MED ORDER — SODIUM CHLORIDE 0.9 % IV SOLN: INTRAVENOUS | Status: DC

## 2022-08-16 MED ORDER — METOPROLOL SUCCINATE ER 25 MG PO TB24
25.0000 mg | ORAL_TABLET | Freq: Once | ORAL | Status: AC
  Administered 2022-08-16: 25 mg via ORAL
  Filled 2022-08-16: qty 1

## 2022-08-16 MED ORDER — METOPROLOL TARTRATE 5 MG/5ML IV SOLN: 5.0000 mg | INTRAVENOUS | Status: DC | PRN

## 2022-08-16 MED ORDER — METOPROLOL SUCCINATE ER 50 MG PO TB24
50.0000 mg | ORAL_TABLET | Freq: Every evening | ORAL | Status: DC
  Administered 2022-08-16: 50 mg via ORAL
  Filled 2022-08-16: qty 1

## 2022-08-16 MED ORDER — SERTRALINE HCL 50 MG PO TABS
25.0000 mg | ORAL_TABLET | Freq: Every day | ORAL | Status: DC
  Administered 2022-08-16 – 2022-08-18 (×2): 25 mg via ORAL
  Filled 2022-08-16 (×3): qty 1

## 2022-08-16 NOTE — Plan of Care (Signed)

## 2022-08-16 NOTE — Consult Note (Signed)
Cardiology Consultation:   Patient ID: Joe Curtis MRN: [id number]; DOB: 11/26/2000  Admit date: 08/14/2022 Date of Consult: 08/16/2022  Primary Care Provider: [facility] Cardiologist: Rollene Rotunda, MD  [facility] Electrophysiologist:  None    Patient Profile:   Joe Curtis is a 22 y.o. 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  History of Present Illness:   [name] presented to the ED 08/14/2022 with complaint of palpitations since 8pm, and was found to be in afib with RVR in the 140s. He was initially given IV lopressors pushes, without improvement in HR. Subsequently, he was started on a cardizem drip, after which his rates improved to the 70s-90s, but still in atrial fibrillation. He was not cardioverted due to questionable compliance with apixaban.  The patient was continued on his metoprolol succinate 25mg  daily (last dose 1951). He was started on cardiazem CD 120mg  daily (dosed at 1527), and his diltiazem infusion was stopped a few hours after his initial dose. In the past 6 hours, has had an increase in his HR to the 110s.  On exam, he states he has felt increasingly uncomfortable from palpitations. He said he has had afib for at least 2 years. Previously only having episodes about every 3 months, but he thinks they are increasing in frequency, with last episode in May, and then this episode. He was about to be cardioverted with his last episode of afib, but reports he converted soon before the procedure. Says episodes at most typically last 2 days, and then he self converts. He is scheduled for VSD repair reportedly at [facility] August 29th.  Past Medical History:  Diagnosis Date   A-fib [facility])    Dextratransposition of aorta    VSD  (ventricular septal defect)     Past Surgical History:  Procedure Laterality Date   CARDIAC CATHETERIZATION     EYE SURGERY       Home Medications:  Prior to Admission medications   Medication Sig Start Date End Date Taking? Authorizing Provider  apixaban (ELIQUIS) 5 MG TABS tablet Take 5 mg by mouth 2 (two) times daily.   Yes [provider]  metoprolol tartrate (LOPRESSOR) 25 MG tablet Take 25 mg by mouth daily.   Yes [provider]  Multiple Vitamin (MULTIVITAMIN) tablet Take 1 tablet by mouth daily.   Yes [provider]  meloxicam (MOBIC) 15 MG tablet Take 15 mg by mouth daily. Patient not taking: Reported on 08/15/2022    [provider]  metoprolol succinate (TOPROL-XL) 25 MG 24 hr tablet Take 1 tablet (25 mg total) by mouth every evening. Patient not taking: Reported on 08/15/2022 06/08/22   Perlie Gold, PA-C  rivaroxaban (XARELTO) 20 MG TABS tablet Take 1 tablet (20 mg total) by mouth daily with supper. Patient not taking: Reported on 08/15/2022 06/08/22   Perlie Gold, PA-C    Inpatient Medications: Scheduled Meds:  apixaban  5 mg Oral BID   diltiazem  120 mg Oral Daily   meloxicam  15 mg Oral Q breakfast   metoprolol succinate  25 mg Oral QPM   Continuous Infusions:  diltiazem (CARDIZEM) infusion Stopped (08/15/22 1730)   PRN Meds: acetaminophen  Allergies:   No Known Allergies  Social History:   Social History   Socioeconomic History  Marital status: Single    Spouse name: Not on file   Number of children: Not on file   Years of education: Not on file   Highest education level: Not on file  Occupational History   Not on file  Tobacco Use   Smoking status: Never   Smokeless tobacco: Never  Vaping Use   Vaping status: Former   Substances: CBD  Substance and Sexual Activity   Alcohol use: Never   Drug use: Yes    Frequency: 1.0 times per week    Types: Marijuana    Comment: Once or twice a week   Sexual activity:  Not Currently  Other Topics Concern   Not on file  Social History Narrative   Not on file   Social Determinants of Health   Financial Resource Strain: Low Risk  (02/24/2022)   Received from [facility], [facility]   Overall Financial Resource Strain (CARDIA)    Difficulty of Paying Living Expenses: Not very hard  Food Insecurity: No Food Insecurity (08/15/2022)   Hunger Vital Sign    Worried About Running Out of Food in the Last Year: Never true    Ran Out of Food in the Last Year: Never true  Transportation Needs: No Transportation Needs (08/15/2022)   PRAPARE - Administrator, Civil Service (Medical): No    Lack of Transportation (Non-Medical): No  Physical Activity: Not on File (04/30/2021)   Received from [place], [place]   Physical Activity    Physical Activity: 0  Stress: Not on File (04/30/2021)   Received from [facility], [place]   Stress    Stress: 0  Social Connections: Not on File (04/30/2021)   Received from [place], [place]   Social Connections    Social Connections and Isolation: 0  Intimate Partner Violence: Not At Risk (08/15/2022)   Humiliation, Afraid, Rape, and Kick questionnaire    Fear of Current or Ex-Partner: No    Emotionally Abused: No    Physically Abused: No    Sexually Abused: No    Family History:   History reviewed. No pertinent family history.   ROS:  Review of Systems: [y] = yes, [ ]  = no      General: Weight gain [ ] ; Weight loss [ ] ; Anorexia [ ] ; Fatigue [ ] ; Fever [ ] ; Chills [ ] ; Weakness [ ]    Cardiac: Chest pain/pressure [ ] ; Resting SOB [ ] ; Exertional SOB [ ] ; Orthopnea [ ] ; Pedal Edema [ ] ; Palpitations [y]; Syncope [ ] ; Presyncope [ ] ; Paroxysmal nocturnal dyspnea [ ]    Pulmonary: Cough [ ] ; Wheezing [ ] ; Hemoptysis [ ] ; Sputum [ ] ; Snoring [ ]    GI: Vomiting [ ] ; Dysphagia [ ] ; Melena [ ] ; Hematochezia [ ] ; Heartburn [ ] ; Abdominal pain [ ] ; Constipation [ ] ; Diarrhea [ ] ; BRBPR [ ]    GU: Hematuria [ ] ; Dysuria [ ] ; Nocturia [  ] Vascular: Pain in legs with walking [ ] ; Pain in feet with lying flat [ ] ; Non-healing sores [ ] ; Stroke [ ] ; TIA [ ] ; Slurred speech [ ] ;   Neuro: Headaches [ ] ; Vertigo [ ] ; Seizures [ ] ; Paresthesias [ ] ;Blurred vision [ ] ; Diplopia [ ] ; Vision changes [ ]    Ortho/Skin: Arthritis [ ] ; Joint pain [ ] ; Muscle pain [ ] ; Joint swelling [ ] ; Back Pain [ ] ; Rash [ ]    Psych: Depression [ ] ; Anxiety [ ]    Heme: Bleeding problems [ ] ;  Clotting disorders [ ] ; Anemia [ ]    Endocrine: Diabetes [ ] ; Thyroid dysfunction [ ]    Physical Exam/Data:   Vitals:   08/15/22 1528 08/15/22 1951 08/15/22 2000 08/15/22 2133  BP: 111/61 112/60  93/73  Pulse: 91 90    Resp:    18  Temp:    98.7 F (37.1 C)  TempSrc:    Oral  SpO2: 98%  99% 98%  Weight:      Height:        Intake/Output Summary (Last 24 hours) at 08/16/2022 0047 Last data filed at 08/15/2022 0122 Gross per 24 hour  Intake --  Output 700 ml  Net -700 ml      08/14/2022   10:42 PM 06/08/2022    4:04 AM 06/07/2022    6:19 AM  Last 3 Weights  Weight (lbs) 140 lb 139 lb 4.8 oz 138 lb 6.4 oz  Weight (kg) 63.504 kg 63.186 kg 62.778 kg     Body mass index is 18.47 kg/m.  General:  Well nourished, well developed, in no acute distress HEENT: normal Lymph: no adenopathy Neck: no JVD Endocrine:  No thryomegaly Vascular: No carotid bruits; FA pulses 2+ bilaterally without bruits  Cardiac:  normal S1, S2; RRR; 4/6 blowing harsh murmur Lungs:  clear to auscultation bilaterally, no wheezing, rhonchi or rales  Abd: soft, nontender, no hepatomegaly  Ext: no edema Musculoskeletal:  No deformities, BUE and BLE strength normal and equal Skin: warm and dry  Neuro:  CNs 2-12 intact, no focal abnormalities noted Psych:  Normal affect   EKG:  The EKG was personally reviewed and demonstrates: afib with RVR with ventricular rate 120s Telemetry:  Telemetry was personally reviewed and demonstrates:   9am Hrs in the 70s 11am Hrs in the 70s 1pm high  90s-low 110s 1am 100s-110  Relevant CV Studies: No echo in our system  Laboratory Data:  High Sensitivity Troponin:  No results for input(s): "TROPONINIHS" in the last 720 hours.   Chemistry Recent Labs  Lab 08/14/22 2301  NA 136  K 3.8  CL 104  CO2 21*  GLUCOSE 94  BUN 15  CREATININE 0.75  CALCIUM 9.4  GFRNONAA >60  ANIONGAP 11    No results for input(s): "PROT", "ALBUMIN", "AST", "ALT", "ALKPHOS", "BILITOT" in the last 168 hours. Hematology Recent Labs  Lab 08/14/22 2300  WBC 4.8  RBC 4.78  HGB 13.5  HCT 39.7  MCV 83.1  MCH 28.2  MCHC 34.0  RDW 11.5  PLT 204   BNPNo results for input(s): "BNP", "PROBNP" in the last 168 hours.  DDimer No results for input(s): "DDIMER" in the last 168 hours.  Radiology/Studies:  DG Chest Port 1 View  Result Date: 08/14/2022 CLINICAL DATA:  Atrial fibrillation, palpitations. Dextro transposition of the great arteries and VSD. EXAM: PORTABLE CHEST 1 VIEW COMPARISON:  06/06/2022. FINDINGS: The heart size and mediastinal contours are stable. No consolidation, effusion, or pneumothorax. There is S shaped scoliosis of the thoracic spine. IMPRESSION: Stable chest with no acute cardiopulmonary process. Electronically Signed   By: Thornell Sartorius M.D.   On: 08/14/2022 23:28   { Assessment and Plan:   Zymeer Hewitt is a 22 y.o. 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  #Afib with RVR Presenting with symptomatic palpitations, found to be in afib with  RVR in the 140s. This was initially controlled with a dilt gtt, however after conversion to PO diltiazem, he has had an increase in his HR back up to 110 currently.  -Continue diltiazem 24 hour release 120mg  daily -START IV metoprolol tartrate 5mg  q93min prn HR >100 -Increase metoprolol succinate from  25mg  to 50mg  daily, one time dose now of 25mg   -If remains in afib 8/5 evening, plan for TEE/DCCV on 8/6 (has missed doses of apixaban). Would also make npo at midnight 8/5 evening in preparation -Would benefit from rhythm control strategy in outpatient setting after his VSD repair in the end of August; would recommend outpatient EP evaluation at the location of the patients choice -Continue apixaban 5mg  BID      For questions or updates, please contact [facility] Please consult [email or web address] for contact info under     Signed, Freddy Finner, MD  08/16/2022 12:47 AM

## 2022-08-16 NOTE — Consult Note (Signed)
Joe Curtis Psychiatry Consult Evaluation  Service Date: August 16, 2022 LOS:  LOS: 0 days    Primary Psychiatric Diagnoses  MDD, single episode, severe 2.  Social anxiety disorder   Assessment  Joe Curtis is a 22 y.o. male admitted medically for 08/14/2022 10:48 PM for Afib with RVR. 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 Psychiatry was consulted for suicidal ideations by Dr. Debby Curtis on 8/4.  His current presentation of several weeks of low mood, poor sleep, anhedonia, guilt, decreased energy is most consistent with a diagnosis of major depressive disorder.  He separately meets criteria for social anxiety disorder and likely PTSD (medical trauma). Pt was amenable to starting sertraline/zoloft today at the smallest dose. Spoke with him about the expected side effects, the time to expect improvement, and how it would proceed when he moves to the outpatient setting. Discussed our desire to set him up with a psychiatrist and some options for helping him find an outpatient therapist. He agreed with the plan as outlined above.   Pt does not meet criteria for inpatient psychiatric hospitalization.    Diagnoses:  Active Hospital problems: Principal Problem:   Atrial fibrillation with rapid ventricular response (HCC) Active Problems:   Atrial fibrillation with RVR (HCC)   Depression with suicidal ideation     Plan   ## Psychiatric Medication Recommendations:  -- Start zoloft 25 mg daily   ## Medical Decision Making Capacity:  Need to clarify - chart states pt has legal guardian   ## Further Work-up:  -- none currently   -- most recent EKG on 8/4 had QtC of 459 notable for afib   ## Disposition:  -- There are no current psychiatric contraindications to discharge at this time.  Plan to set up followup resources tomorrow.  -- Discussed [email or web address] resources with him, will provide on his paperwork.  ## Behavioral / Environmental:  --   No specific recommendations at this time.     ## Safety and Observation Level:  - Based on my clinical evaluation, I estimate the patient to be at low risk of self harm in the current setting - At this time, we recommend a telesitter level of observation. This decision is based on my review of the chart including patient's history and current presentation, interview of the patient, mental status examination, and consideration of suicide risk including evaluating suicidal ideation, plan, intent, suicidal or self-harm behaviors, risk factors, and protective factors. This judgment is based on our ability to directly address suicide risk, implement suicide prevention strategies and develop a safety plan while the patient is in the clinical setting. Please contact our team if there is a concern that risk level has changed.  Suicide risk assessment  Patient has following modifiable risk factors for suicide: under treated depression  and lack of access to outpatient mental health resources, which we are addressing by prescribing zoloft, providing resources for outpt therapy.   Patient has following non-modifiable or demographic risk factors for suicide: male gender and history of self harm behavior, chronic medical condition.  Patient has the following protective factors against suicide: Supportive family and Supportive friends   Thank you for this consult request. Recommendations have been communicated to the primary team.  We will continue to follow at this time.   Margaretmary Dys, MD  Psychiatric and  Social History   Relevant Aspects of Hospital Course:  Admitted on 08/14/2022 for afib with rvr. They screened + on CSSRS.   Patient Report:  Patient seen in early afternoon.  Has girlfriend by bedside he asks to stay  in the room for support.  Beyond this, he endorses current depression characterized by aforementioned difficulties with sleep, anhedonia, guilt (primarily over bullying his younger brother as a teenager), decreased energy.  Endorses some symptoms of panic (mostly when waking up from sleep) but mostly symptoms of social anxiety including avoiding groups of people, avoiding customers at work, etc.  No current or historic mania or psychotic symptoms.  Brief overview of cannabinoid effects on depression provided a psychoeducation.  Denies synthetic cannabinol use.  Psych ROS:  See HPI.   Collateral information:  Spoke to pt's mother Joe Curtis [phone]) at 4:25 pm. She confirmed the details of the situation as relayed by Domenic Schwab and gave more background into the psychosocial situation. Difficult relations between his parents and a failed reconciliation process. Mother was in [place] looking for a house during the initial phase of the hospitalization and has now abandoned plans to move there with pt's father.  She will be at bedside this evening 8/5 or tomorrow.  Psychiatric History:  Information collected from pt, medical reccord  Prev Dx/Sx: none Current Psych Provider: none Home Meds (current): none Previous Med Trials: none Therapy: none  Prior Psych Hospitalization: no  Prior Self Harm: suicidal gesture w/ metoprolol, 1 remote episode of cutting Prior Violence: denied  Family Psych History: pt denied Family Hx suicide: no  Social History:  Educational Hx: grad high school Occupational Hx: retail at [employer] Hx: denied Living Situation: with mom and one of his brothers (not the 75 y/o he feels guilt over) Spiritual Hx: denied Access to weapons: no  Substance History Tobacco use: no Alcohol use: rarely Drug use: every day, in evenings.    Exam Findings   Psychiatric Specialty Exam:  Presentation  General Appearance: Appropriate for Environment  Eye  Contact:Good  Speech:Clear and Coherent  Speech Volume:Normal  Handedness:Right   Mood and Affect  Mood:Depressed  Affect:Congruent (sluggish)   Thought Process  Thought Processes:Coherent; Goal Directed; Linear  Descriptions of Associations:Intact  Orientation:Full (Time, Place and Person)  Thought Content:-- (devoid of delusions, paranoia)  Hallucinations:Hallucinations: None  Ideas of Reference:None  Suicidal Thoughts:Suicidal Thoughts: No  Homicidal Thoughts:Homicidal Thoughts: No   Sensorium  Memory:Immediate Good; Recent Good; Remote Good  Judgment:Fair  Insight:Good   Executive Functions  Concentration:Good  Attention Span:Good  Recall:Good  Fund of Knowledge:Fair  Language:Good   Psychomotor Activity  Psychomotor Activity:Psychomotor Activity: Normal   Assets  Assets:Desire for Improvement; Housing; Research scientist (medical); Resilience   Sleep  Sleep:Sleep: Fair    Physical Exam: Vital signs:  Temp:  [98.7 F (37.1 C)] 98.7 F (37.1 C) (08/05 0356) Pulse Rate:  [90-117] 100 (08/05 0356) Resp:  [16-18] 18 (08/05 0356) BP: (93-112)/(60-74) 99/66 (08/05 0356) SpO2:  [96 %-99 %] 96 % (08/05 0400) Physical Exam Vitals and nursing note reviewed.  Constitutional:      Comments: Thin   HENT:     Head: Normocephalic and atraumatic.  Eyes:     Conjunctiva/sclera: Conjunctivae normal.  Pulmonary:     Effort: Pulmonary effort is normal.  Skin:    General: Skin is warm and dry.  Neurological:     Mental Status: He is alert and oriented to person, place, and time.  Psychiatric:  Attention and Perception: Attention and perception normal.        Mood and Affect: Mood is depressed.        Speech: Speech normal.        Behavior: Behavior normal.        Thought Content: Thought content normal.        Judgment: Judgment normal.     Blood pressure 99/66, pulse 100, temperature 98.7 F (37.1 C), temperature source Oral, resp. rate 18,  height 6\' 1"  (1.854 m), weight 63.5 kg, SpO2 96%. Body mass index is 18.47 kg/m.   Other History   These have been pulled in through the EMR, reviewed, and updated if appropriate.   Family History:  The patient's family history is not on file.  Medical History: Past Medical History:  Diagnosis Date   A-fib [facility])    Dextratransposition of aorta    VSD (ventricular septal defect)     Surgical History: Past Surgical History:  Procedure Laterality Date   CARDIAC CATHETERIZATION     EYE SURGERY      Medications:   Current Facility-Administered Medications:    0.9 %  sodium chloride infusion, , Intravenous, Continuous, Meng, [place], PA   acetaminophen (TYLENOL) tablet 500 mg, 500 mg, Oral, Q4H PRN, Norins, Rosalyn Gess, MD, 500 mg at 08/15/22 1559   apixaban (ELIQUIS) tablet 5 mg, 5 mg, Oral, BID, Norins, Rosalyn Gess, MD, 5 mg at 08/16/22 1027   diltiazem (CARDIZEM CD) 24 hr capsule 120 mg, 120 mg, Oral, Daily, Norins, Rosalyn Gess, MD, 120 mg at 08/16/22 0942   [COMPLETED] diltiazem (CARDIZEM) 1 mg/mL load via infusion 10 mg, 10 mg, Intravenous, Once, 10 mg at 08/15/22 0007 **AND** diltiazem (CARDIZEM) 125 mg in dextrose 5% 125 mL (1 mg/mL) infusion, 5-15 mg/hr, Intravenous, Continuous, Long, Arlyss Repress, MD, Stopped at 08/15/22 1730   meloxicam (MOBIC) tablet 15 mg, 15 mg, Oral, Q breakfast, Norins, Rosalyn Gess, MD, 15 mg at 08/16/22 0818   metoprolol succinate (TOPROL-XL) 24 hr tablet 50 mg, 50 mg, Oral, QPM, Freddy Finner, MD   metoprolol tartrate (LOPRESSOR) injection 5 mg, 5 mg, Intravenous, Q5 min PRN, Freddy Finner, MD  Allergies: No Known Allergies

## 2022-08-16 NOTE — Progress Notes (Signed)
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 Initially on Cardizem infusion which was discontinued during day shift after his rate improved.  He is currently on metoprolol succinate 25 mg daily (last dose at 1951) and Cardizem CD 120 mg daily (last dose at 1527).  Notified by RN that his heart rate is now ranging between 110s to 140s.  Most recent blood pressure 102/76. I spoke to Dr. Hulan Saas with cardiology, he will see the patient in consultation and give recommendations.  Appreciate help.

## 2022-08-16 NOTE — Progress Notes (Signed)
Echocardiogram 2D Echocardiogram has been performed.  Irish Lack, RDCS 08/16/2022, 2:19 PM

## 2022-08-16 NOTE — Progress Notes (Addendum)
Interval coverage note:  22 y.o. male with PMH of afib on Eliquis, mesocardia, congenitally corrected transposition of great arteries, VSD, corrected strabismus and scoliosis who presented for evaluation of afib with RVR.   Subjective:  Has palpitation started around 2 days ago, has not gone away since.  Objective: General: alert and oriented, resting comfortably, no distress Neck: normal Lung: clear to auscultation Heart: 3/6 murmur, irregularly irregular  Plans PAF with RVR  - last episode of afib was in May, initially planned TEE DCCV due to mixed compliance with Eliquis, spontaneously converted to NSR prior to the procedure. Eliquis swithced to Xarelto for better compliance  - metoprolol succinate increased to 50mg  daily. IV cardizem converted to 120mg  daily Cardizem CD. Home Xarelto was switched back to Eliquis, according to the patient, after his discharge in May, he finished a bottle of Xarelto, however never got a refill, therefore went back to his old Eliquis. He says there was no gap between the switch, however there has been some concern with compliance of Eliquis in the past   - HR improved to 90s to low 100s after medication increase, will discuss with MD, consider TEE DCCV tomorrow if he does not convert. Unable to further uptitrate rate control meds.   Congenital heart disease: pending VSD closure w possible ASD closure at [facility] on Aug 29th  Transposition of great arteries: s/p congenital correction  Depression: seen by psychiatry, felt to suidal ideation Signed, Azalee Course PA Pager: [phone]  Patient seen and examined.  Agree with plan as above.  HR currently in 100s but remains symptomatic.  He previously reported that he had missed doses of Eliquis though to me states that he thinks he has not.  Would recommend TEE given uncertainty.  Plan for TEE/DCCV tomorrow if remains in Afib. Little Ishikawa, MD

## 2022-08-16 NOTE — Progress Notes (Signed)
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  Found to be in A-fib with RVR with rate in the 140s.  No response to IV Lopressor push. Subsequently placed on Cardizem drip after which his rate improved to 70-90s .  Labs were within normal range.  There was some concern for depression and possible suicidal ideation show psychiatry was also consulted he was then admitted hospital for further evaluation and treatment.  Assessment and Plan: * Atrial fibrillation with rapid ventricular response  Patient with chronic a fib wit two prior episodes this year of RVR requiring in-patient care, compliant with medication.  Continue metoprolol, Cardizem 120 mg has been added.   Cardiology has seen the patient today and metoprolol dose has been increased to 50 mg daily from 25 mg..  If still in A-fib by the evening plan for TEE and DC cardioversion on 8/ 6/24.  Patient is supposed to go for VSD repair at the end of August so cardiology recommends outpatient NPL evaluation at that time.  Continue Eliquis 5 mg twice daily.   Depression with concerns for suicidal ideation, MDD severe single episode. Social anxiety disorder Psychiatry on board.  Plan for likely SSRI.  Psychiatry at this time feels low risk for harm.     DVT prophylaxis:  apixaban (ELIQUIS) tablet 5 mg   Code Status:   Full code  Disposition: Home likely in 1 to 2 days  Status is: Inpatient  Remains inpatient appropriate because: Afib with RVR, possible need for cardioversion   Family Communication:  Girlfriend at bedside.  Consultants:  cardiology  Procedures:  None yet  Antimicrobials:   None  Anti-infectives (From admission, onward)    None       Subjective:  Today, patient was seen and examined at bedside. Girlfriend at bedside. No chest pain but dyspnea on exertion. No fever chills.   Objective: Vitals:   08/16/22 0000 08/16/22 0200 08/16/22 0356 08/16/22 0400  BP:  103/74 99/66   Pulse:  (!) 117 100   Resp:  16 18   Temp:  98.7 F (37.1 C) 98.7 F (37.1 C)   TempSrc:  Oral Oral   SpO2: 99% 98% 99% 96%  Weight:      Height:        Intake/Output Summary (Last 24 hours) at 08/16/2022 1247 Last data filed at 08/16/2022 0600 Gross per 24 hour  Intake 200 ml  Output 1900 ml  Net -1700 ml   Filed Weights   08/14/22 2242  Weight: 63.5 kg    Physical Examination: Body mass index is 18.47 kg/m.  General:  Average built, not in obvious distress HENT:   No scleral pallor or icterus noted. Oral mucosa is moist.  Chest:  Clear breath sounds.  Diminished breath sounds bilaterally. No crackles or wheezes. Chest wall scar. CVS: S1 &S2 heard. Murmer noted, irregular HR Abdomen: Soft, nontender, nondistended.  Bowel sounds are heard.   Extremities: No cyanosis, clubbing or edema.  Peripheral pulses are palpable. Psych: Alert, awake and oriented, normal mood CNS:  No cranial nerve deficits.  Power equal in all extremities.   Skin: Warm and dry.  No rashes noted.  Data Reviewed:   CBC:  Recent Labs  Lab 08/14/22 2300  WBC 4.8  HGB 13.5  HCT 39.7  MCV 83.1  PLT 204    Basic Metabolic Panel: Recent Labs  Lab 08/14/22 2301 08/14/22 2319  NA 136  --   K 3.8  --   CL 104  --   CO2 21*  --   GLUCOSE 94  --   BUN 15  --   CREATININE 0.75  --   CALCIUM 9.4  --   MG  --  1.9    Liver Function Tests: No results for input(s): "AST", "ALT", "ALKPHOS", "BILITOT", "PROT", "ALBUMIN" in the last 168 hours.   Radiology Studies: DG Chest Port 1 View  Result Date: 08/14/2022 CLINICAL DATA:  Atrial fibrillation, palpitations. Dextro transposition of  the great arteries and VSD. EXAM: PORTABLE CHEST 1 VIEW COMPARISON:  06/06/2022. FINDINGS: The heart size and mediastinal contours are stable. No consolidation, effusion, or pneumothorax. There is S shaped scoliosis of the thoracic spine. IMPRESSION: Stable chest with no acute cardiopulmonary process. Electronically Signed   By: Thornell Sartorius M.D.   On: 08/14/2022 23:28      LOS: 0 days    Joe Das, MD [facility] Available via Epic secure chat 7am-7pm After these hours, please refer to coverage provider listed on [email or web address] 08/16/2022, 12:47 PM

## 2022-08-16 NOTE — Hospital Course (Addendum)
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  Found to be in A-fib with RVR with rate in the 140s.  No response to IV Lopressor push. Subsequently placed on Cardizem drip after which his rate improved to 70-90s .  Labs were within normal range.  He was then admitted hospital for further evaluation and treatment.  Assessment and Plan: * Atrial fibrillation with rapid ventricular response  Patient with chronic a fib wit two prior episodes this year of RVR requiring in-patient care, compliant with medication.  Continue metoprolol, Cardizem 120 mg has been added.   Cardiology has seen the patient today and metoprolol dose has been increased to 50 mg daily from 25 mg..  If still in A-fib by the evening plan for TEE and DC cardioversion on 8/ 6/24.  Patient is supposed to go for VSD repair at the end of August so cardiology recommends outpatient NPL evaluation at that time.  Continue Eliquis 5 mg twice daily.   Depression with concerns for suicidal ideation, MDD severe single episode. Social anxiety disorder Psychiatry on board.  Plan for likely SSRI.  Psychiatry at this time feels low risk for harm.

## 2022-08-17 ENCOUNTER — Inpatient Hospital Stay (HOSPITAL_COMMUNITY): Payer: Medicaid Other | Admitting: Anesthesiology

## 2022-08-17 ENCOUNTER — Inpatient Hospital Stay (HOSPITAL_COMMUNITY): Payer: Medicaid Other

## 2022-08-17 ENCOUNTER — Encounter (HOSPITAL_COMMUNITY)
Admission: EM | Disposition: A | Discharge status: Home / Self Care | Payer: Self-pay | Source: Home / Self Care | Attending: Internal Medicine

## 2022-08-17 ENCOUNTER — Encounter (HOSPITAL_COMMUNITY): Payer: Self-pay | Admitting: Internal Medicine

## 2022-08-17 DIAGNOSIS — I4891 Unspecified atrial fibrillation: Secondary | ICD-10-CM | POA: Diagnosis not present

## 2022-08-17 DIAGNOSIS — I480 Paroxysmal atrial fibrillation: Secondary | ICD-10-CM

## 2022-08-17 DIAGNOSIS — F32A Depression, unspecified: Secondary | ICD-10-CM | POA: Diagnosis not present

## 2022-08-17 DIAGNOSIS — Q203 Discordant ventriculoarterial connection: Secondary | ICD-10-CM

## 2022-08-17 HISTORY — PX: CARDIOVERSION: SHX1299

## 2022-08-17 HISTORY — PX: TEE WITHOUT CARDIOVERSION: SHX5443

## 2022-08-17 SURGERY — ECHOCARDIOGRAM, TRANSESOPHAGEAL
Anesthesia: Monitor Anesthesia Care

## 2022-08-17 MED ORDER — METOPROLOL SUCCINATE ER 50 MG PO TB24
50.0000 mg | ORAL_TABLET | Freq: Every day | ORAL | Status: DC
  Administered 2022-08-18: 50 mg via ORAL
  Filled 2022-08-17: qty 1

## 2022-08-17 MED ORDER — PROPOFOL 500 MG/50ML IV EMUL
INTRAVENOUS | Status: DC | PRN
  Administered 2022-08-17: 150 ug/kg/min via INTRAVENOUS

## 2022-08-17 MED ORDER — SUCCINYLCHOLINE CHLORIDE 200 MG/10ML IV SOSY
PREFILLED_SYRINGE | INTRAVENOUS | Status: DC | PRN
  Administered 2022-08-17: 100 mg via INTRAVENOUS

## 2022-08-17 MED ORDER — ONDANSETRON HCL 4 MG/2ML IJ SOLN
INTRAMUSCULAR | Status: DC | PRN
  Administered 2022-08-17: 4 mg via INTRAVENOUS

## 2022-08-17 MED ORDER — LIDOCAINE 2% (20 MG/ML) 5 ML SYRINGE
INTRAMUSCULAR | Status: DC | PRN
  Administered 2022-08-17: 40 mg via INTRAVENOUS

## 2022-08-17 MED ORDER — PHENOL 1.4 % MT LIQD
1.0000 | OROMUCOSAL | Status: DC | PRN
  Administered 2022-08-17: 1 via OROMUCOSAL
  Filled 2022-08-17: qty 177

## 2022-08-17 MED ORDER — DEXAMETHASONE SODIUM PHOSPHATE 10 MG/ML IJ SOLN
INTRAMUSCULAR | Status: DC | PRN
Start: 2022-08-17 — End: 2022-08-17
  Administered 2022-08-17: 10 mg via INTRAVENOUS

## 2022-08-17 MED ORDER — METOPROLOL SUCCINATE ER 50 MG PO TB24: 50.0000 mg | ORAL_TABLET | Freq: Every day | ORAL | Status: DC

## 2022-08-17 MED ORDER — ESMOLOL HCL 100 MG/10ML IV SOLN
INTRAVENOUS | Status: DC | PRN
  Administered 2022-08-17 (×2): 30 ug via INTRAVENOUS

## 2022-08-17 MED ORDER — PROPOFOL 10 MG/ML IV BOLUS
INTRAVENOUS | Status: DC | PRN
Start: 2022-08-17 — End: 2022-08-17
  Administered 2022-08-17: 50 mg via INTRAVENOUS
  Administered 2022-08-17: 30 mg via INTRAVENOUS
  Administered 2022-08-17: 70 mg via INTRAVENOUS

## 2022-08-17 SURGICAL SUPPLY — 1 items: ELECT DEFIB PAD ADLT CADENCE (PAD) ×1 IMPLANT

## 2022-08-17 NOTE — Plan of Care (Signed)

## 2022-08-17 NOTE — Anesthesia Postprocedure Evaluation (Signed)
Anesthesia Post Note  Patient: Pharmacologist  Procedure(s) Performed: TRANSESOPHAGEAL ECHOCARDIOGRAM CARDIOVERSION     Patient location during evaluation: PACU Anesthesia Type: MAC Level of consciousness: awake and alert Pain management: pain level controlled Vital Signs Assessment: post-procedure vital signs reviewed and stable Respiratory status: spontaneous breathing, nonlabored ventilation and respiratory function stable Cardiovascular status: blood pressure returned to baseline Postop Assessment: no apparent nausea or vomiting Anesthetic complications: no   No notable events documented.  Last Vitals:  Vitals:   08/17/22 1205 08/17/22 1250  BP: (!) 87/55 (!) 84/58  Pulse: 76 88  Resp:  12  Temp:    SpO2: 99%     Last Pain:  Vitals:   08/17/22 1203  TempSrc: Oral  PainSc: 4                  Shanda Howells

## 2022-08-17 NOTE — Progress Notes (Signed)
Patient returned from procedure, sleepy but arousable, oriented and following instructions appropriately.  BP 87/57 and rechecked following reposition of cuff with same result; other vitals WDL.  MD, Dr. Jerene Pitch updated at bedside; no bolus IVF at this time.  Patient placed on bedside monitor and will trend vital signs Q2hrs.

## 2022-08-17 NOTE — Progress Notes (Addendum)
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  Found to be in A-fib with RVR with rate in the 140s.  No response to IV Lopressor push. Subsequently placed on Cardizem drip after which his rate improved to 70-90s .  Labs were within normal range.  There was some concern for depression and possible suicidal ideation so psychiatry was also consulted he was then admitted hospital for further evaluation and treatment.  Assessment and Plan:  Atrial fibrillation with rapid ventricular response  Patient with chronic a fib wit two prior episodes this year of RVR requiring in-patient care, compliant with medication.  Continue metoprolol, Cardizem.   Cardiology on board and planned for TEE and DC cardioversion on 8/ 6/24 but was un successful due to inability to advance the tube however patient spontaneously converted..  Plan for esophagram.  Patient is supposed to go for VSD repair at the end of August so cardiology recommends outpatient. Continue Eliquis 5 mg twice daily.   Depression with concerns for suicidal ideation, MDD severe single episode. Social anxiety disorder Psychiatry consulted and patient has been started on Zoloft 25 mg daily.  Psychiatry at this time feels low risk for harm.  Discontinue one-to-one Comptroller.   DVT prophylaxis:  apixaban (ELIQUIS) tablet 5 mg   Code Status:   Full code  Disposition: Home likely in 1 to 2 days when okay with cardiology.  Status is: Inpatient  Remains inpatient appropriate because: Atrial fibrillation, overnight observation,   Family Communication:  Spoke with the patient's mother at  bedside  Consultants:  cardiology  Procedures:  Attempted TEE  Antimicrobials:  None  Anti-infectives (From admission, onward)    None       Subjective:  Today, patient was seen and examined at bedside after attempted TEE.  TEE could not be performed due to difficulty advancing the tube.  Complains of mild sore throat.  Patient's mother at bedside.  Denies any chest pain dyspnea or shortness of breath.  Patient however had spontaneous conversion to normal sinus rhythm.    Objective: Vitals:   08/17/22 1145 08/17/22 1203 08/17/22 1205 08/17/22 1250  BP: 103/62 (!) 87/57 (!) 87/55 (!) 84/58  Pulse: 76 73 76 88  Resp: 10 12  12   Temp:  98.2 F (36.8 C)    TempSrc:  Oral    SpO2: 99% 100% 99%   Weight:      Height:        Intake/Output Summary (Last 24 hours) at 08/17/2022 1428 Last data filed at 08/17/2022 1110 Gross per 24 hour  Intake 200 ml  Output 1200 ml  Net -1000 ml   Filed Weights   08/14/22 2242  Weight: 63.5 kg    Physical Examination: Body mass index is 18.47 kg/m.  General:  Average built, not in obvious distress HENT:   No scleral pallor or icterus noted. Oral mucosa is moist.  Chest:  Clear breath sounds.  Diminished breath sounds bilaterally. No crackles or wheezes. Chest wall scar from previous surgery. CVS: S1 &S2 heard. Murmer noted, regular rhythm at the time of my exam. Abdomen: Soft, nontender, nondistended.  Bowel sounds are heard.   Extremities: No cyanosis, clubbing or edema.  Peripheral pulses are palpable. Psych: Alert, awake and oriented, normal mood CNS:  No cranial nerve deficits.  Power equal in all extremities.   Skin: Warm and dry.  No rashes noted.  Data Reviewed:   CBC: Recent Labs  Lab 08/14/22 2300 08/17/22 0345  WBC 4.8 6.1  HGB 13.5 13.7  HCT 39.7 41.5  MCV 83.1 85.0  PLT 204 258    Basic Metabolic Panel: Recent Labs  Lab 08/14/22 2301 08/14/22 2319 08/17/22 0345  NA 136  --  135  K 3.8  --  4.3  CL 104   --  106  CO2 21*  --  23  GLUCOSE 94  --  85  BUN 15  --  15  CREATININE 0.75  --  0.83  CALCIUM 9.4  --  9.1  MG  --  1.9 1.9    Liver Function Tests: No results for input(s): "AST", "ALT", "ALKPHOS", "BILITOT", "PROT", "ALBUMIN" in the last 168 hours.   Radiology Studies: EP STUDY  Result Date: 08/17/2022 See surgical note for result.  ECHOCARDIOGRAM COMPLETE  Result Date: 08/16/2022    ECHOCARDIOGRAM REPORT   Patient Name:   Joe Curtis Date of Exam: 08/16/2022 Medical Rec #:  [id number]         Height:       73.0 in Accession #:    [id number]        Weight:       140.0 lb Date of Birth:  2000-02-12          BSA:          1.849 m Patient Age:    22 years          BP:           96/63 mmHg Patient Gender: M                 HR:           101 bpm. Exam Location:  Inpatient Procedure: 2D Echo, Cardiac Doppler and Color Doppler Indications:    palpitations  History:        Patient has no prior history of Echocardiogram examinations.                 L-TGA, mesocardia, VSD.  Sonographer:    Meagan Baucom RDCS, FE, PE Sonographer#2:  Melissa Morford RDCS (AE, PE) Referring Phys: HAO MENG IMPRESSIONS  1. L-TGA , morphologic LV globally mildly reduced.  2. VSD is perimembranous. There is a small ventricular septal defect.  3. Morphologic RV is dilated, function appears mild-moderately reduced.  4. Left atrial size was moderately dilated.  5. Mild mitral valve regurgitation.  6. Tricuspid valve regurgitation is moderate.  7. The inferior vena cava is normal in size with greater than 50% respiratory variability, suggesting right atrial pressure of 3 mmHg. Conclusion(s)/Recommendation(s): Would consider comparison via cardia MRI if clinically indicated with prior CMR 12/14//2023 from [facility]. FINDINGS  Left Ventricle: L-TGA , morphologic LV globally mildly reduced. Right Ventricle: Morphologic RV is dilated, function appears mild-moderately reduced. Left Atrium: Left atrial size was moderately dilated. Right  Atrium: Right atrial size was normal in size. Pericardium: There is no evidence of pericardial effusion. Mitral Valve: Mild mitral valve regurgitation. Tricuspid Valve: Tricuspid valve regurgitation is moderate. Pulmonic Valve: Pulmonic valve regurgitation is mild to moderate. Venous: The inferior vena cava is normal in size with greater than 50% respiratory variability, suggesting right atrial pressure of 3 mmHg. IAS/Shunts: There is a small  ventricular septal defect with shunting.  LEFT ATRIUM         Index LA diam:    4.30 cm 2.33 cm/m  AORTIC VALVE LVOT Vmax:   86.20 cm/s LVOT Vmean:  60.500 cm/s LVOT VTI:    0.149 m  SHUNTS Systemic VTI: 0.15 m Carolan Clines Electronically signed by Carolan Clines Signature Date/Time: 08/16/2022/7:04:08 PM    Final       LOS: 1 day    [name], MD [facility] Available via Epic secure chat 7am-7pm After these hours, please refer to coverage provider listed on [email or web address] 08/17/2022, 2:28 PM

## 2022-08-17 NOTE — Anesthesia Preprocedure Evaluation (Signed)
Anesthesia Evaluation  Patient identified by MRN, date of birth, ID band Patient awake    Reviewed: Allergy & Precautions, NPO status , Patient's Chart, lab work & pertinent test results  History of Anesthesia Complications Negative for: history of anesthetic complications  Airway Mallampati: II  TM Distance: >3 FB Neck ROM: Full    Dental  (+) Teeth Intact, Dental Advisory Given   Pulmonary neg pulmonary ROS   breath sounds clear to auscultation       Cardiovascular + dysrhythmias Atrial Fibrillation  Rhythm:Irregular  1. L-TGA , morphologic LV globally mildly reduced.   2. VSD is perimembranous. There is a small ventricular septal defect.   3. Morphologic RV is dilated, function appears mild-moderately reduced.   4. Left atrial size was moderately dilated.   5. Mild mitral valve regurgitation.   6. Tricuspid valve regurgitation is moderate.   7. The inferior vena cava is normal in size with greater than 50%  respiratory variability, suggesting right atrial pressure of 3 mmHg.     Neuro/Psych negative neurological ROS  negative psych ROS   GI/Hepatic negative GI ROS, Neg liver ROS,,,  Endo/Other  negative endocrine ROS    Renal/GU negative Renal ROS     Musculoskeletal negative musculoskeletal ROS (+)    Abdominal   Peds  Hematology  (+) Blood dyscrasia Lab Results      Component                Value               Date                      WBC                      6.1                 08/17/2022                HGB                      13.7                08/17/2022                HCT                      41.5                08/17/2022                MCV                      85.0                08/17/2022                PLT                      258                 08/17/2022            eliquis   Anesthesia Other Findings   Reproductive/Obstetrics                               Anesthesia Physical Anesthesia  Plan  ASA: 2  Anesthesia Plan: General and MAC   Post-op Pain Management: Minimal or no pain anticipated   Induction:   PONV Risk Score and Plan: 2 and Propofol infusion and Treatment may vary due to age or medical condition  Airway Management Planned: Nasal Cannula, Natural Airway and Simple Face Mask  Additional Equipment: None  Intra-op Plan:   Post-operative Plan:   Informed Consent: I have reviewed the patients History and Physical, chart, labs and discussed the procedure including the risks, benefits and alternatives for the proposed anesthesia with the patient or authorized representative who has indicated his/her understanding and acceptance.     Dental advisory given  Plan Discussed with: CRNA  Anesthesia Plan Comments:          Anesthesia Quick Evaluation

## 2022-08-17 NOTE — Interval H&P Note (Signed)
History and Physical Interval Note:  08/17/2022 10:08 AM  Joe Curtis  has presented today for surgery, with the diagnosis of afib.  The various methods of treatment have been discussed with the patient and family. After consideration of risks, benefits and other options for treatment, the patient has consented to  Procedure(s): TRANSESOPHAGEAL ECHOCARDIOGRAM (N/A) CARDIOVERSION (N/A) as a surgical intervention.  The patient's history has been reviewed, patient examined, no change in status, stable for surgery.  I have reviewed the patient's chart and labs.  Questions were answered to the patient's satisfaction.     Armanda Magic

## 2022-08-17 NOTE — CV Procedure (Signed)
     PROCEDURE NOTE:  Procedure:  Transesophageal echocardiogram Operator:  Armanda Magic, MD Indications:  Atrial Fibrillation Complications: None  During this procedure the patient is administered a total of Propofol 320 mg to achieve and maintain moderate conscious sedation.  The patient's heart rate, blood pressure, and oxygen saturation are monitored continuously during the procedure by anesthesia. There were multiple attempts made at TEE probe placement but were met with difficulty at the level of the vocal cords.  The patient had a drop in his O2 sats to 48% requiring emergent intubation after receiving Succinylcholine.  A glide scope was used to visualize the vocal cords and then after placement of ETT, the glide scope was left in place to try to help with intubation of the esophagus.  The probe was place at the opening of the esophagus but despite multiple attempts could not successfully be placed in the esophagus and the procedure was terminated.    The patient was extubated and later transferred back to his room in stable condition.  Procedure was discussed with Dr. Bjorn Pippin with Cardiology.  Will need further imaging of his esophagus to rule out obstruction or anomaly. A total of 1 hour was spent with active patient care.  Signed: Armanda Magic, MD [facility]

## 2022-08-17 NOTE — Anesthesia Procedure Notes (Signed)
Procedure Name: MAC Date/Time: 08/17/2022 10:25 AM  Performed by: Randon Goldsmith, CRNAPre-anesthesia Checklist: Patient identified, Emergency Drugs available, Suction available and Patient being monitored Patient Re-evaluated:Patient Re-evaluated prior to induction Oxygen Delivery Method: Nasal cannula Preoxygenation: Pre-oxygenation with 100% oxygen

## 2022-08-17 NOTE — Progress Notes (Addendum)
   Patient Name: Joe Curtis Date of Encounter: 08/17/2022 [facility] Cardiologist: Rollene Rotunda, MD   Interval Summary  .    22 yr old male with PMH of mesocardia,  L-transposition of the great arteries, small perimembranous outlet ventricular septal defect, mild subvalvar pulmonary stenosis, paroxysmal A fib, cardiology is consulted and following for A fib RVR.   Patient underwent TEE/DCCV today, probe can not be passed, he had spontaneous conversion, feeling well, no chest pain.   Vital Signs .    Vitals:   08/16/22 2043 08/17/22 0000 08/17/22 0645 08/17/22 0838  BP: 105/66  93/70 (!) 111/92  Pulse: 96  90 (!) 101  Resp: 17  18 14   Temp: 98.2 F (36.8 C)  98.1 F (36.7 C) 98.2 F (36.8 C)  TempSrc: Oral  Oral Temporal  SpO2:  98% 99% (!) 83%  Weight:      Height:        Intake/Output Summary (Last 24 hours) at 08/17/2022 0957 Last data filed at 08/17/2022 0630 Gross per 24 hour  Intake 200 ml  Output 1200 ml  Net -1000 ml      08/14/2022   10:42 PM 06/08/2022    4:04 AM 06/07/2022    6:19 AM  Last 3 Weights  Weight (lbs) 140 lb 139 lb 4.8 oz 138 lb 6.4 oz  Weight (kg) 63.504 kg 63.186 kg 62.778 kg      Telemetry/ECG    Sinus rhythm - Personally Reviewed  Physical Exam .   GEN: No acute distress.   Neck: No JVD Cardiac: RRR,  systolic murmur  Respiratory: Clear to auscultation bilaterally. GI: Soft, nontender, non-distended  MS: No leg edema  Assessment & Plan .     Paroxysmal A fib RVR - presented with palpitation with VR up to 140s - initially diagnosed in 2022, was maintained on Eliquis and metoprolol by his pediatric cardiologist at [facility] - rate control has been difficult with PO metoprolol and diltiazem, he is now s/p TEE/DCCV today (has missed doses of apixaban in the past), unable pass the probe but spontaneously converted to SR.  Continue metoprolol and Eliquis - will continue telemetry -Recommend esophagram   L-transposition of the  great arteries VSD - follows [facility] cardiology with plan for closure of VSD at the end of August   Depression  - per primary team      For questions or updates, please contact [facility] Please consult [email or web address] for contact info under        Signed, Cyndi Bender, NP    Patient seen and examined.  Agree with above documentation.  On exam, patient is somnolent but arousable, regular rate and rhythm, no murmurs, lungs CTAB, no LE edema or JVD.  TEE/DCCV was unable to be done today as probe could not be passed.  Will check esophagram to evaluate for esophageal stricture.  However in recovery converted to sinus rhythm.  Will continue metoprolol and Eliquis.  Little Ishikawa, MD

## 2022-08-17 NOTE — Transfer of Care (Signed)
Immediate Anesthesia Transfer of Care Note  Patient: Joe Curtis  Procedure(s) Performed: TRANSESOPHAGEAL ECHOCARDIOGRAM CARDIOVERSION  Patient Location: Cath Lab  Anesthesia Type:General  Level of Consciousness: drowsy  Airway & Oxygen Therapy: Patient Spontanous Breathing and Patient connected to nasal cannula oxygen  Post-op Assessment: Report given to RN and Post -op Vital signs reviewed and stable  Post vital signs: Reviewed and stable  Last Vitals:  Vitals Value Taken Time  BP 86/73   Temp    Pulse 129 08/17/22 1115  Resp 20 08/17/22 1115  SpO2 97 % 08/17/22 1115  Vitals shown include unfiled device data.  Last Pain:  Vitals:   08/17/22 1111  TempSrc: Temporal  PainSc: 0-No pain      Patients Stated Pain Goal: 0 (08/17/22 1610)  Complications: No notable events documented.

## 2022-08-17 NOTE — Anesthesia Procedure Notes (Signed)
Procedure Name: Intubation Date/Time: 08/17/2022 10:36 AM  Performed by: Randon Goldsmith, CRNAPre-anesthesia Checklist: Patient identified, Emergency Drugs available, Suction available and Patient being monitored Patient Re-evaluated:Patient Re-evaluated prior to induction Oxygen Delivery Method: Circle system utilized Preoxygenation: Pre-oxygenation with 100% oxygen Induction Type: IV induction Ventilation: Mask ventilation without difficulty Laryngoscope Size: Glidescope and 4 Grade View: Grade I Tube type: Oral Tube size: 7.5 mm Number of attempts: 1 Airway Equipment and Method: Stylet and Oral airway Placement Confirmation: ETT inserted through vocal cords under direct vision, positive ETCO2 and breath sounds checked- equal and bilateral Secured at: 21 cm Tube secured with: Tape Dental Injury: Teeth and Oropharynx as per pre-operative assessment

## 2022-08-18 ENCOUNTER — Encounter (HOSPITAL_COMMUNITY): Payer: Self-pay | Admitting: Cardiology

## 2022-08-18 DIAGNOSIS — F32A Depression, unspecified: Secondary | ICD-10-CM | POA: Diagnosis not present

## 2022-08-18 DIAGNOSIS — I4891 Unspecified atrial fibrillation: Secondary | ICD-10-CM | POA: Diagnosis not present

## 2022-08-18 DIAGNOSIS — I480 Paroxysmal atrial fibrillation: Secondary | ICD-10-CM | POA: Diagnosis not present

## 2022-08-18 DIAGNOSIS — Q203 Discordant ventriculoarterial connection: Secondary | ICD-10-CM | POA: Diagnosis not present

## 2022-08-18 LAB — TSH: TSH: 0.475 u[IU]/mL (ref 0.350–4.500)

## 2022-08-18 MED ORDER — SERTRALINE HCL 25 MG PO TABS: 25.0000 mg | ORAL_TABLET | Freq: Every day | ORAL | 2 refills | Status: AC

## 2022-08-18 MED ORDER — METOPROLOL SUCCINATE ER 50 MG PO TB24: 50.0000 mg | ORAL_TABLET | Freq: Every evening | ORAL | 2 refills | Status: AC

## 2022-08-18 NOTE — Discharge Summary (Signed)
Physician Discharge Summary  Joe Curtis WUJ:[id number] DOB: [birth year] DOA: 08/14/2022  PCP: [facility]  Admit date: 08/14/2022 Discharge date: 08/18/2022  Admitted From: Home  Discharge disposition: Home   Recommendations for Outpatient Follow-Up:   Follow up with your primary care provider in one week.  Check CBC, BMP, magnesium in the next visit Follow-up with electrophysiology cardiology as outpatient as scheduled by the clinic. Follow-up with adult congenital cardiology at [facility].   Discharge Diagnosis:   Principal Problem:   Atrial fibrillation with rapid ventricular response (HCC) Active Problems:   Depression with suicidal ideation   PAF (paroxysmal atrial fibrillation) (HCC)   TGA (transposition of great arteries)   Atrial fibrillation with RVR (HCC)   Discharge Condition: Improved.  Diet recommendation: Low sodium, heart healthy.   Wound care: None.  Code status: Full.   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  Found to be in A-fib with RVR with rate in the 140s.  No response to IV Lopressor push. Subsequently placed on Cardizem drip after which his rate improved to 70-90s .  Labs were within normal range.  There was some concern for depression and possible suicidal ideation so psychiatry was also consulted he was then admitted hospital for further evaluation and treatment.   Hospital Course:   Following conditions were addressed during hospitalization as listed below,  Atrial fibrillation with rapid ventricular response  Resolved.  Patient with chronic a fib wit two prior episodes this year of RVR requiring in-patient care, compliant with medication.  Continue metoprolol, Cardizem.  Dose of  metoprolol has been increased at this time.  Cardiology on board and planned for TEE and DC cardioversion on 8/ 6/24 but was un successful due to inability to advance the tube however patient spontaneously converted..  Esophagogram did not show any signs of stricture.  Patient is supposed to go for VSD repair at the end of August.  At this time patient has been seen by cardiology and okay for discharge home with increased dose of metoprolol and Eliquis.  Patient was on 25 mg of metoprolol at home which has been increased to 50 mg on discharge.  Patient will need to follow-up with electrophysiology cardiology as outpatient (cardiology office to schedule an appointment).  He was also advised to follow-up with adult congenital cardiology at [facility].   Depression with concerns for suicidal ideation, MDD severe single episode. Social anxiety disorder Psychiatry was consulted and patient has been started on Zoloft 25 mg daily.  Psychiatry at this time feels low risk for harm.  Suicide precautions have been discontinued.  Disposition.  At this time, patient is stable for disposition home with outpatient PCP and cardiology follow-up.  Medical Consultants:   Cardiology  Procedures:    Attempted TEE Subjective:   Today, patient was seen and examined at bedside.  Denies any chest pain,  fever, shortness of breath, dyspnea.  Remains converted to sinus rhythm.  Discharge Exam:   Vitals:   08/18/22 0400 08/18/22 0450  BP:  (!) 108/56  Pulse:  88  Resp:  16  Temp:  97.7 F (36.5 C)  SpO2: 98%    Vitals:   08/17/22 2054 08/18/22 0000 08/18/22 0400 08/18/22 0450  BP: 108/63   (!) 108/56  Pulse:    88  Resp: 14   16  Temp: 97.7 F (36.5 C)   97.7 F (36.5 C)  TempSrc: Oral   Oral  SpO2:  99% 98%   Weight:      Height:       General: Alert awake, not in obvious distress, Communicative HENT: pupils equally reacting to light,  No scleral pallor or icterus noted. Oral mucosa is moist.  Chest:   Clear breath sounds.  No crackles or wheezes.  Chest wall scar. CVS: S1 &S2 heard. No murmur.  Regular rate and rhythm. Abdomen: Soft, nontender, nondistended.  Bowel sounds are heard.   Extremities: No cyanosis, clubbing or edema.  Peripheral pulses are palpable. Psych: Alert, awake and oriented, normal mood CNS:  No cranial nerve deficits.  Power equal in all extremities.   Skin: Warm and dry.  No rashes noted.  The results of significant diagnostics from this hospitalization (including imaging, microbiology, ancillary and laboratory) are listed below for reference.     Diagnostic Studies:   DG Chest Port 1 View  Result Date: 08/14/2022 CLINICAL DATA:  Atrial fibrillation, palpitations. Dextro transposition of the great arteries and VSD. EXAM: PORTABLE CHEST 1 VIEW COMPARISON:  06/06/2022. FINDINGS: The heart size and mediastinal contours are stable. No consolidation, effusion, or pneumothorax. There is S shaped scoliosis of the thoracic spine. IMPRESSION: Stable chest with no acute cardiopulmonary process. Electronically Signed   By: Thornell Sartorius M.D.   On: 08/14/2022 23:28     Labs:   Basic Metabolic Panel: Recent Labs  Lab 08/14/22 2301 08/14/22 2319 08/17/22 0345 08/18/22 0750  NA 136  --  135 134*  K 3.8  --  4.3 3.6  CL 104  --  106 99  CO2 21*  --  23 22  GLUCOSE 94  --  85 157*  BUN 15  --  15 18  CREATININE 0.75  --  0.83 0.91  CALCIUM 9.4  --  9.1 9.3  MG  --  1.9 1.9  --    GFR Estimated Creatinine Clearance: 116.3 mL/min (by C-G formula based on SCr of 0.91 mg/dL). Liver Function Tests: No results for input(s): "AST", "ALT", "ALKPHOS", "BILITOT", "PROT", "ALBUMIN" in the last 168 hours. No results for input(s): "LIPASE", "AMYLASE" in the last 168 hours. No results for input(s): "AMMONIA" in the last 168 hours. Coagulation profile No results for input(s): "INR", "PROTIME" in the last 168 hours.  CBC: Recent Labs  Lab 08/14/22 2300 08/17/22 0345  08/18/22 0750  WBC 4.8 6.1 9.8  HGB 13.5 13.7 11.6*  HCT 39.7 41.5 35.0*  MCV 83.1 85.0 83.5  PLT 204 258 220   Cardiac Enzymes: No results for input(s): "CKTOTAL", "CKMB", "CKMBINDEX", "TROPONINI" in the last 168 hours. BNP: Invalid input(s): "POCBNP" CBG: No results for input(s): "GLUCAP" in the last 168 hours. D-Dimer No results for input(s): "DDIMER" in the last 72 hours. Hgb A1c No results for input(s): "HGBA1C" in the last 72 hours. Lipid Profile No results for input(s): "CHOL", "HDL", "LDLCALC", "TRIG", "CHOLHDL", "LDLDIRECT" in the last 72 hours. Thyroid function studies Recent Labs    08/18/22 0751  TSH 0.475   Anemia work up No results for input(s): "VITAMINB12", "FOLATE", "FERRITIN", "TIBC", "IRON", "RETICCTPCT" in the last 72 hours. Microbiology No results found for this or any previous visit (from the past 240 hour(s)).   Discharge Instructions:   Discharge Instructions     Diet - low sodium heart healthy   Complete by: As directed  Discharge instructions   Complete by: As directed    Follow-up with your primary care provider in 1 week.  Check blood work at that time.  Follow-up with electrophysiology cardiology as scheduled by the clinic.  Follow-up with adult congenital cardiology at [facility].  Take medications as prescribed.  No overexertion.  Seek medical attention for worsening symptoms.   Increase activity slowly   Complete by: As directed       Allergies as of 08/18/2022   No Known Allergies      Medication List     STOP taking these medications    metoprolol tartrate 25 MG tablet Commonly known as: LOPRESSOR   Xarelto 20 MG Tabs tablet Generic drug: rivaroxaban       TAKE these medications    Eliquis 5 MG Tabs tablet Generic drug: apixaban Take 5 mg by mouth 2 (two) times daily.   meloxicam 15 MG tablet Commonly known as: MOBIC Take 15 mg by mouth daily.   metoprolol succinate 50 MG 24 hr tablet Commonly known as:  TOPROL-XL Take 1 tablet (50 mg total) by mouth every evening. What changed:  medication strength how much to take   multivitamin tablet Take 1 tablet by mouth daily.   sertraline 25 MG tablet Commonly known as: ZOLOFT Take 1 tablet (25 mg total) by mouth daily. Start taking on: August 19, 2022        Follow-up Information     [facility] Follow up in 1 week(s).   Specialty: Pediatrics Contact information: [address] [place] [address] [phone]                  Time coordinating discharge: 39 minutes  Signed:  Yeilyn Gent  [facility] 08/18/2022, 2:36 PM

## 2022-08-18 NOTE — Progress Notes (Signed)
Message sent to arrange EP follow up in 2 -3 weeks. Patient will be called.

## 2022-08-18 NOTE — TOC CM/SW Note (Signed)
Transition of Care [facility]) - Inpatient Brief Assessment   Patient Details  Name: Joe Curtis MRN: [id number] Date of Birth: 12-Nov-2000  Transition of Care [facility]) CM/SW Contact:    Gala Lewandowsky, RN Phone Number: 08/18/2022, 10:50 AM   Clinical Narrative: Patient presented for palpitations. Patient has Medicaid and PCP. No home needs identified at this time.    Transition of Care Asessment: Insurance and Status: Insurance coverage has been reviewed Patient has primary care physician: Yes Prior/Current Home Services: No current home services Social Determinants of Health Reivew: SDOH reviewed no interventions necessary Readmission risk has been reviewed: Yes Transition of care needs: no transition of care needs at this time

## 2022-08-18 NOTE — Consult Note (Signed)
Joe Curtis Psychiatry Consult Evaluation  Service Date: August 18, 2022 LOS:  LOS: 2 days    Primary Psychiatric Diagnoses  MDD, single episode, severe 2.  Social anxiety disorder   Assessment  Joe Curtis is a 22 y.o. male admitted medically for 08/14/2022 10:48 PM for Afib with RVR. 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 Psychiatry was consulted for suicidal ideations by Dr. Debby Bud on 8/4.  His current presentation of several weeks of low mood, poor sleep, anhedonia, guilt, decreased energy is most consistent with a diagnosis of major depressive disorder. He separately meets criteria for social anxiety disorder and likely PTSD (medical trauma). Pt was amenable to starting sertraline/zoloft. Spoke with him about the expected side effects, the time to expect improvement, and how it would proceed when he moves to the outpatient setting. Discussed our desire to set him up with a psychiatrist and some options for helping him find an outpatient therapist. He agreed with the plan as outlined above.   Pt does not meet criteria for inpatient psychiatric hospitalization.    Diagnoses:  Active Hospital problems: Principal Problem:   Atrial fibrillation with rapid ventricular response (HCC) Active Problems:   PAF (paroxysmal atrial fibrillation) (HCC)   TGA (transposition of great arteries)   Atrial fibrillation with RVR (HCC)   Depression with suicidal ideation     Plan   ## Psychiatric Medication Recommendations:  -- Start zoloft 25 mg daily   ## Medical Decision Making Capacity:  Need to clarify - chart states pt has legal guardian   ## Further Work-up:  -- none currently  -- most recent EKG on 8/4 had QtC of 459 notable for afib   ## Disposition:  -- There are no current  psychiatric contraindications to discharge at this time. Plan to set up followup resources tomorrow.  -- Discussed psychologytoday.com resources with him, will provide on his paperwork.  ## Behavioral / Environmental:  --   No specific recommendations at this time.   Outpatient Therapy Resources:  Psychology Today Therapist Finding Tool:  https://www.psychologytoday.com/us/therapists Filter by location, telehealth vs in person, insurance, types of therapy offered.  [facility]: http://[place]-martinez.net/ Phone: (867)474-6957  [facility] Care: [email or web address] Phone: 9317397642  Group Finder Tool: https://www.psychologytoday.com/us/groups/ Filter by location, telehealth vs in person, insurance, types of conditions supported.  [facility]: RecordDebt.fi   ## Safety and Observation Level:  - Based on my clinical evaluation, I estimate the patient to be at low risk of self harm in the current setting - At this time, we recommend a telesitter level of observation. This decision is based on my review of the chart including patient's history and current presentation, interview of the patient, mental status examination, and consideration of suicide risk including evaluating suicidal ideation, plan, intent, suicidal or self-harm behaviors, risk factors, and protective factors. This judgment is based on our ability to directly address suicide risk, implement suicide prevention strategies and develop a safety plan while the patient is in the clinical setting. Please contact our team if there is a concern that risk level has changed.  Suicide risk assessment  Patient has following modifiable risk factors for suicide: under treated depression  and lack of access to outpatient mental health  resources, which we are addressing by prescribing zoloft, providing resources for outpt therapy.  Patient has following non-modifiable or demographic risk factors for suicide: male gender and history of self harm behavior, chronic medical condition.  Patient has the following protective factors against suicide: Supportive family and Supportive friends   Thank you for this consult request. Recommendations have been communicated to the primary team.  We will sign off at this time.   Margaretmary Dys, MD  Psychiatric and Social History   Relevant Aspects of Hospital Course:  Admitted on 08/14/2022 for afib with rvr. They screened + on CSSRS.   Patient Report:  Patient seen in morning.  Has girlfriend by bedside he asks to stay in the room for support.  Discussed his surgery yesterday and he seemed hopeful that he would be discharging today. Based on current understanding, that is still the plan. Asked the patient about barriers to med compliance in the past. He identified that he had had trouble with medications previously (not psych) because of time management. Discussed the importance of cell phone alerts and attempted to normalize these challenges as part of being a young person.  Psych ROS:  See HPI.   Collateral information:  Safety planning conducted 8/6  Psychiatric History:  Information collected from pt, medical reccord  Prev Dx/Sx: none Current Psych Provider: none Home Meds (current): none Previous Med Trials: none Therapy: none  Prior Psych Hospitalization: no  Prior Self Harm: suicidal gesture w/ metoprolol, 1 remote episode of cutting Prior Violence: denied  Family Psych History: pt denied Family Hx suicide: no  Social History:  Educational Hx: grad high school Occupational Hx: retail at Event organiser Hx: denied Living Situation: with mom and one of his brothers (not the 40 y/o he feels guilt over) Spiritual Hx: denied Access to weapons:  no  Substance History Tobacco use: no Alcohol use: rarely Drug use: every day, in evenings.    Exam Findings   Psychiatric Specialty Exam:  Presentation  General Appearance: Appropriate for Environment  Eye Contact:Good  Speech:Clear and Coherent; Normal Rate  Speech Volume:Normal  Handedness:Right   Mood and Affect  Mood:Euthymic  Affect:Congruent; Appropriate   Thought Process  Thought Processes:Coherent; Goal Directed; Linear  Descriptions of Associations:Intact  Orientation:Full (Time, Place and Person)  Thought Content:Abstract Reasoning; Logical  Hallucinations:Hallucinations: None   Ideas of Reference:None  Suicidal Thoughts:Suicidal Thoughts: No   Homicidal Thoughts:Homicidal Thoughts: No    Sensorium  Memory:Immediate Good; Recent Good; Remote Good  Judgment:Good  Insight:Good   Executive Functions  Concentration:Good  Attention Span:Good  Recall:Good  Fund of Knowledge:Good  Language:Good   Psychomotor Activity  Psychomotor Activity:Psychomotor Activity: Normal    Assets  Assets:Desire for Improvement; Manufacturing systems engineer; Housing; Research scientist (medical); Health and safety inspector; Leisure Time   Sleep  Sleep:Sleep: Good     Physical Exam: Vital signs:  Temp:  [97.7 F (36.5 C)-98.2 F (36.8 C)] 97.7 F (36.5 C) (08/07 0450) Pulse Rate:  [73-88] 88 (08/07 0450) Resp:  [10-16] 16 (08/07 0450) BP: (84-108)/(55-63) 108/56 (08/07 0450) SpO2:  [98 %-100 %] 98 % (08/07 0400) Physical Exam Vitals and nursing note reviewed.  Constitutional:      Comments: Thin   HENT:     Head: Normocephalic and atraumatic.  Eyes:     Conjunctiva/sclera: Conjunctivae normal.  Pulmonary:     Effort: Pulmonary effort is normal.  Skin:    General: Skin is warm and dry.  Neurological:     Mental Status: He is alert and oriented to person, place, and time.  Psychiatric:  Attention and Perception: Attention and perception  normal.        Mood and Affect: Mood is depressed.        Speech: Speech normal.        Behavior: Behavior normal.        Thought Content: Thought content normal.        Judgment: Judgment normal.     Blood pressure (!) 108/56, pulse 88, temperature 97.7 F (36.5 C), temperature source Oral, resp. rate 16, height 6\' 1"  (1.854 m), weight 63.5 kg, SpO2 98%. Body mass index is 18.47 kg/m.   Other History   These have been pulled in through the EMR, reviewed, and updated if appropriate.   Family History:  The patient's family history is not on file.  Medical History: Past Medical History:  Diagnosis Date   A-fib [facility])    Dextratransposition of aorta    VSD (ventricular septal defect)     Surgical History: Past Surgical History:  Procedure Laterality Date   CARDIAC CATHETERIZATION     CARDIOVERSION N/A 08/17/2022   Procedure: CARDIOVERSION;  Surgeon: Quintella Reichert, MD;  Location: [facility] INVASIVE CV LAB;  Service: Cardiovascular;  Laterality: N/A;   EYE SURGERY     TEE WITHOUT CARDIOVERSION N/A 08/17/2022   Procedure: TRANSESOPHAGEAL ECHOCARDIOGRAM;  Surgeon: Quintella Reichert, MD;  Location: [facility] INVASIVE CV LAB;  Service: Cardiovascular;  Laterality: N/A;    Medications:   Current Facility-Administered Medications:    acetaminophen (TYLENOL) tablet 500 mg, 500 mg, Oral, Q4H PRN, Norins, Rosalyn Gess, MD, 500 mg at 08/17/22 2148   apixaban (ELIQUIS) tablet 5 mg, 5 mg, Oral, BID, Norins, Rosalyn Gess, MD, 5 mg at 08/18/22 0941   meloxicam (MOBIC) tablet 15 mg, 15 mg, Oral, Q breakfast, Norins, Rosalyn Gess, MD, 15 mg at 08/18/22 0941   metoprolol succinate (TOPROL-XL) 24 hr tablet 50 mg, 50 mg, Oral, Daily, Little Ishikawa, MD, 50 mg at 08/18/22 0940   metoprolol tartrate (LOPRESSOR) injection 5 mg, 5 mg, Intravenous, Q5 min PRN, Freddy Finner, MD   phenol (CHLORASEPTIC) mouth spray 1 spray, 1 spray, Mouth/Throat, PRN, Pokhrel, Laxman, MD, 1 spray at 08/17/22 1344   sertraline  (ZOLOFT) tablet 25 mg, 25 mg, Oral, Daily, Margaretmary Dys, MD, 25 mg at 08/18/22 5284  Allergies: No Known Allergies

## 2022-08-18 NOTE — Progress Notes (Addendum)
Patient Name: Joe Curtis Date of Encounter: 08/18/2022 [facility] Cardiologist: Rollene Rotunda, MD   Interval Summary  .    22 yr old male with PMH of mesocardia,  L-transposition of the great arteries, small perimembranous outlet ventricular septal defect, mild subvalvar pulmonary stenosis, paroxysmal A fib, cardiology is consulted and following for A fib RVR. TEE/DCCV 08/17/22 unsuccessful due to inability to advanced probe. He spontaneously converted to sinus rhythm during the procedure.   Patient states he felt a few episodes of heart palpitations last night, short lived lasting few minutes, denied any chest pain, SOB, dizziness, syncope.   Vital Signs .    Vitals:   08/17/22 2054 08/18/22 0000 08/18/22 0400 08/18/22 0450  BP: 108/63   (!) 108/56  Pulse:    88  Resp: 14   16  Temp: 97.7 F (36.5 C)   97.7 F (36.5 C)  TempSrc: Oral   Oral  SpO2:  99% 98%   Weight:      Height:        Intake/Output Summary (Last 24 hours) at 08/18/2022 0733 Last data filed at 08/17/2022 1110 Gross per 24 hour  Intake --  Output 0 ml  Net 0 ml      08/14/2022   10:42 PM 06/08/2022    4:04 AM 06/07/2022    6:19 AM  Last 3 Weights  Weight (lbs) 140 lb 139 lb 4.8 oz 138 lb 6.4 oz  Weight (kg) 63.504 kg 63.186 kg 62.778 kg      Telemetry/ECG    Sinus rhythm - Personally Reviewed  Physical Exam .   GEN: No acute distress.   Neck: No JVD Cardiac: RRR,  grade III systolic murmur throughout  Respiratory: Clear to auscultation bilaterally. On room air. Speaks full sentence  GI: Soft, nontender, non-distended  MS: No leg edema  Assessment & Plan .     Paroxysmal A fib RVR - initially diagnosed in 2022, was maintained on Eliquis and metoprolol by his pediatric cardiologist at [facility] - presented with heart palpitation with ventricular rate up to 140s, work up so far did not reveal any significant infection or metabolic derangement, add TSH today  - Echo 8/5 showed L-TGA,  morphologic LV globally mildly reduced, VSD is perimembranous. There is a small ventricular septal defect. Morphologic RV is dilated, function appears mild-moderately reduced. Mod LAE, mild MR, mod TR - rate control was difficult with PO metoprolol and diltiazem, TEE/DCCV on 08/17/22 was unsuccessful due to inability to advanced probe, fortunately he spontaneously converted to sinus rhythm and remains in sinus at this time  - esophagram 8/6 normal, consider GI evaluation if future TEE probe can't be advanced  - Will continue toprolol XL 50mg  daily for rate control and PTA Eliquis 5mg  BID at time of discharge  - follow up with primary cardiologist at [facility]   L-transposition of the great arteries VSD - follows [facility] cardiology with plan for closure of VSD at the end of August   Depression  - per primary team   [facility] will sign off.   Medication Recommendations: Toprol-XL 50 mg daily, Eliquis 5 mg twice daily Other recommendations (labs, testing, etc): None Follow up as an outpatient: Has follow-up with cardiology at [facility]    For questions or updates, please contact [facility] Please consult [email or web address] for contact info under        Signed, Cyndi Bender, NP     Patient seen and examined.  Agree with  above documentation.  On exam, patient is alert and oriented, regular rate and rhythm, 2/6 systolic murmur, lungs CTAB, no LE edema or JVD.  Remains in normal sinus rhythm.  Okay for discharge today, will follow-up with adult congenital cardiology at [facility].  Given he has now had 2 admissions with A-fib, recommend evaluation by EP as an outpatient for rhythm control options.  Discussed that he could either follow-up with EP here or at [facility], states that would prefer to follow here, will schedule with EP.  Little Ishikawa, MD

## 2022-08-18 NOTE — Plan of Care (Signed)
Patient in normal sinus rhythm with no active symptoms; confirmed per 12-lead EKG per Dr. Jerene Pitch at bedside.  Agrees with medications for rate/rhythm control and stroke risk modification.  Patient will plan follow-up outpatient with A-fib clinic.

## 2022-09-01 ENCOUNTER — Encounter: Payer: Self-pay | Admitting: Cardiology

## 2022-09-02 ENCOUNTER — Ambulatory Visit: Payer: Medicaid Other | Admitting: Cardiovascular Disease

## 2022-10-12 ENCOUNTER — Ambulatory Visit: Payer: Medicaid Other | Attending: Cardiovascular Disease | Admitting: Cardiovascular Disease

## 2022-10-13 ENCOUNTER — Encounter: Payer: Self-pay | Admitting: Cardiovascular Disease

## 2023-01-12 ENCOUNTER — Encounter (HOSPITAL_BASED_OUTPATIENT_CLINIC_OR_DEPARTMENT_OTHER): Payer: Self-pay | Admitting: Emergency Medicine

## 2023-01-12 ENCOUNTER — Emergency Department (HOSPITAL_BASED_OUTPATIENT_CLINIC_OR_DEPARTMENT_OTHER)
Admission: EM | Admit: 2023-01-12 | Discharge: 2023-01-13 | Disposition: A | Discharge status: Home / Self Care | Payer: Medicaid Other | Attending: Emergency Medicine | Admitting: Emergency Medicine

## 2023-01-12 ENCOUNTER — Other Ambulatory Visit: Payer: Self-pay

## 2023-01-12 DIAGNOSIS — I480 Paroxysmal atrial fibrillation: Secondary | ICD-10-CM | POA: Insufficient documentation

## 2023-01-12 DIAGNOSIS — R0982 Postnasal drip: Secondary | ICD-10-CM | POA: Diagnosis not present

## 2023-01-12 DIAGNOSIS — Z20822 Contact with and (suspected) exposure to covid-19: Secondary | ICD-10-CM | POA: Diagnosis not present

## 2023-01-12 DIAGNOSIS — J3489 Other specified disorders of nose and nasal sinuses: Secondary | ICD-10-CM | POA: Insufficient documentation

## 2023-01-12 DIAGNOSIS — Z7901 Long term (current) use of anticoagulants: Secondary | ICD-10-CM | POA: Diagnosis not present

## 2023-01-12 DIAGNOSIS — J029 Acute pharyngitis, unspecified: Secondary | ICD-10-CM | POA: Diagnosis present

## 2023-01-12 DIAGNOSIS — Z79899 Other long term (current) drug therapy: Secondary | ICD-10-CM | POA: Insufficient documentation

## 2023-01-12 DIAGNOSIS — Z113 Encounter for screening for infections with a predominantly sexual mode of transmission: Secondary | ICD-10-CM | POA: Diagnosis present

## 2023-01-12 LAB — GROUP A STREP BY PCR: Group A Strep by PCR: NOT DETECTED

## 2023-01-12 LAB — RESP PANEL BY RT-PCR (RSV, FLU A&B, COVID)  RVPGX2
Influenza A by PCR: NEGATIVE
Influenza B by PCR: NEGATIVE
Resp Syncytial Virus by PCR: NEGATIVE
SARS Coronavirus 2 by RT PCR: NEGATIVE

## 2023-01-12 NOTE — ED Triage Notes (Signed)
 Pt c/o continued sore throat since 12/16; positive strep in Oct and Dec and has completed abx

## 2023-01-13 ENCOUNTER — Emergency Department (HOSPITAL_BASED_OUTPATIENT_CLINIC_OR_DEPARTMENT_OTHER)
Admission: EM | Admit: 2023-01-13 | Discharge: 2023-01-13 | Disposition: A | Discharge status: Home / Self Care | Payer: Medicaid Other | Source: Home / Self Care | Attending: Emergency Medicine | Admitting: Emergency Medicine

## 2023-01-13 ENCOUNTER — Other Ambulatory Visit: Payer: Self-pay

## 2023-01-13 ENCOUNTER — Encounter (HOSPITAL_BASED_OUTPATIENT_CLINIC_OR_DEPARTMENT_OTHER): Payer: Self-pay | Admitting: Emergency Medicine

## 2023-01-13 DIAGNOSIS — Z113 Encounter for screening for infections with a predominantly sexual mode of transmission: Secondary | ICD-10-CM | POA: Insufficient documentation

## 2023-01-13 DIAGNOSIS — R0982 Postnasal drip: Secondary | ICD-10-CM | POA: Insufficient documentation

## 2023-01-13 DIAGNOSIS — Z7901 Long term (current) use of anticoagulants: Secondary | ICD-10-CM | POA: Insufficient documentation

## 2023-01-13 MED ORDER — LORATADINE 10 MG PO TABS: 10.0000 mg | ORAL_TABLET | Freq: Every day | ORAL | 0 refills | Status: AC

## 2023-01-13 MED ORDER — FLUTICASONE PROPIONATE 50 MCG/ACT NA SUSP: 1.0000 | Freq: Every day | NASAL | 0 refills | Status: AC

## 2023-01-13 NOTE — ED Provider Notes (Signed)
 [facility] Provider Note  CSN: [id number] Arrival date & time: 01/13/23 0037  Chief Complaint(s) SEXUALLY TRANSMITTED DISEASE  HPI Joe Curtis is a 23 y.o. male seen here less than an hour ago for postnasal drip.  The return requesting STD check.  He denies any dysuria or penile discharge. Recent sexual activity.   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 Authorizing Provider  apixaban  (ELIQUIS ) 5 MG TABS tablet Take 5 mg by mouth 2 (two) times daily.    [provider]  fluticasone  (FLONASE ) 50 MCG/ACT nasal spray Place 1 spray into both nostrils daily. 01/13/23   Trine Raynell Moder, MD  loratadine  (CLARITIN ) 10 MG tablet Take 1 tablet (10 mg total) by mouth daily. 01/13/23   Trine Raynell Moder, MD  meloxicam  (MOBIC ) 15 MG tablet Take 15 mg by mouth daily. Patient not taking: Reported on 08/15/2022    [provider]  metoprolol  succinate (TOPROL -XL) 50 MG 24 hr tablet Take 1 tablet (50 mg total) by mouth every evening. 08/18/22 11/16/22  Pokhrel, Laxman, MD  Multiple Vitamin (MULTIVITAMIN) tablet Take 1 tablet by mouth daily.    [provider]  sertraline  (ZOLOFT ) 25 MG tablet Take 1 tablet (25 mg total) by mouth daily. 08/19/22   Pokhrel, Laxman, MD                                                                                                                                     Allergies Patient has no known allergies.  Review of Systems Review of Systems As noted in HPI  Physical Exam Vital Signs  I have reviewed the triage vital signs BP 118/69 (BP Location: Right Arm)   Pulse 62   Temp 98 F (36.7 C) (Oral)   Resp 18   SpO2 98%   Physical Exam Vitals reviewed.  Constitutional:      General: He is not in acute distress.    Appearance: He is well-developed. He is not diaphoretic.  HENT:     Head: Normocephalic and atraumatic.     Right Ear: External ear normal.     Left Ear: External ear normal.     Nose: Nose normal.     Mouth/Throat:     Mouth: Mucous membranes are moist.  Eyes:     General: No scleral icterus.    Conjunctiva/sclera: Conjunctivae normal.  Neck:     Trachea: Phonation normal.  Cardiovascular:     Rate and Rhythm: Normal rate and regular rhythm.  Pulmonary:     Effort: Pulmonary effort is normal. No respiratory distress.     Breath sounds: No stridor.  Abdominal:     General: There is no distension.  Musculoskeletal:        General: Normal range of motion.     Cervical back: Normal range of motion.  Neurological:     Mental Status: He is alert and oriented to person, place, and time.  Psychiatric:        Behavior: Behavior normal.     ED Results and Treatments Labs (all labs ordered are listed, but only abnormal results are displayed) Labs Reviewed - No data to display                                                                                                                       EKG  EKG Interpretation Date/Time:    Ventricular Rate:    PR Interval:    QRS Duration:    QT Interval:    QTC Calculation:   R Axis:      Text Interpretation:         Radiology No results found.  Medications Ordered in ED Medications - No data to display Procedures Procedures  (including critical care  time) Medical Decision Making / ED Course   Medical Decision Making   Provided with health department information for STD screening    Final Clinical Impression(s) / ED Diagnoses Final diagnoses:  Screening for STDs (sexually transmitted diseases)   The patient appears reasonably screened and/or stabilized for discharge and I doubt any other medical condition or other [facility] requiring further screening, evaluation, or treatment in the ED at this time. I have discussed the findings, Dx and Tx plan with the patient/family who expressed understanding and agree(s) with the plan. Discharge instructions discussed at length. The patient/family was given strict return precautions who verbalized understanding of the instructions. No further questions at time of discharge.  Disposition: Discharge  Condition: Good  ED Discharge Orders     None         Follow Up: Department, [facility] [address] [place] [address] [phone]   call for an appointment [facility], [facility] [address] [place] [address] [phone]       This chart was dictated using voice recognition software.  Despite best efforts to proofread,  errors can occur which can change the documentation meaning.    Trine Raynell Moder, MD 01/13/23 [id number]

## 2023-01-13 NOTE — ED Provider Notes (Signed)
 [facility] Provider Note  CSN: [id number] Arrival date & time: 01/12/23 2226  Chief Complaint(s) Sore Throat  HPI Joe Curtis is a 23 y.o. male     Sore Throat This is a new problem. Episode onset: 2 weeks. The problem occurs constantly. The problem has not changed since onset.Pertinent negatives include no chest pain, no abdominal pain, no headaches and no shortness of breath. Nothing aggravates the symptoms. Nothing relieves the symptoms. He has tried nothing for the symptoms.    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 Authorizing Provider  fluticasone  (FLONASE ) 50 MCG/ACT nasal spray Place 1 spray into both nostrils daily. 01/13/23  Yes Jordon Kristiansen, Raynell Moder, MD  loratadine  (CLARITIN ) 10 MG tablet Take 1 tablet (10 mg total) by mouth daily. 01/13/23  Yes Gilles Trimpe, Raynell Moder, MD  apixaban  (ELIQUIS ) 5 MG TABS tablet Take 5 mg by mouth 2 (two) times daily.    [provider]  meloxicam  (MOBIC ) 15 MG tablet Take 15 mg by mouth daily. Patient not taking: Reported on 08/15/2022    [provider]  metoprolol  succinate (TOPROL -XL) 50 MG 24 hr tablet Take 1 tablet (50 mg total) by mouth every evening. 08/18/22 11/16/22  Pokhrel, Laxman, MD  Multiple Vitamin (MULTIVITAMIN) tablet Take 1 tablet by  mouth daily.    [provider]  sertraline  (ZOLOFT ) 25 MG tablet Take 1 tablet (25 mg total) by mouth daily. 08/19/22   Pokhrel, Laxman, MD                                                                                                                                    Allergies Patient has no known allergies.  Review of Systems Review of Systems  Respiratory:  Negative for shortness of breath.   Cardiovascular:  Negative for chest pain.  Gastrointestinal:  Negative for abdominal pain.  Neurological:  Negative for headaches.   As noted in HPI  Physical Exam Vital Signs  I have reviewed the triage vital signs BP (!) 152/86 (BP Location: Right Arm)   Pulse 68   Temp 97.7 F (36.5 C)   Resp 18   Ht 6' 1 (1.854 m)   Wt 63.5 kg   SpO2 100%   BMI 18.47 kg/m   Physical Exam Vitals reviewed.  Constitutional:      General: He is not in acute distress.    Appearance: He is well-developed. He is not diaphoretic.  HENT:     Head: Normocephalic and atraumatic.     Right Ear: External ear normal.     Left Ear: External ear normal.     Nose: Mucosal edema and rhinorrhea present.     Right Turbinates: Not swollen.     Left Turbinates: Swollen.     Mouth/Throat:     Mouth: Mucous membranes are moist.     Palate: No lesions.     Pharynx: Postnasal drip present.     Tonsils: No tonsillar exudate.  Eyes:     General: No scleral icterus.    Conjunctiva/sclera: Conjunctivae normal.  Neck:     Trachea: Phonation normal.  Cardiovascular:     Rate and Rhythm: Normal rate and regular rhythm.  Pulmonary:     Effort: Pulmonary effort is normal. No respiratory distress.     Breath sounds: No stridor.  Abdominal:     General: There is no distension.  Musculoskeletal:        General: Normal range of motion.     Cervical back: Normal range of motion.  Neurological:     Mental Status: He is alert and oriented to person, place, and time.  Psychiatric:        Behavior: Behavior  normal.     ED Results and Treatments Labs (all labs ordered are listed, but only abnormal results are displayed) Labs Reviewed  RESP PANEL BY RT-PCR (RSV, FLU A&B, COVID)  [id number]  GROUP A STREP BY PCR                                                                                                                         EKG  EKG Interpretation Date/Time:    Ventricular Rate:    PR Interval:    QRS Duration:    QT Interval:    QTC Calculation:   R Axis:      Text Interpretation:         Radiology No results found.  Medications Ordered in ED Medications - No data to display Procedures Procedures  (including critical care time) Medical Decision Making / ED Course   Medical Decision Making   2 weeks of sore throat. DDx considered. Exam with rhinitis and PND. Given lack of fever, favor allergic over infectious. No PTA. Doubt RPA. Viral panel negative for Covid, flu, RSV. Strep negative.     Final Clinical Impression(s) / ED Diagnoses Final diagnoses:  PND (post-nasal drip)   The patient appears reasonably screened and/or stabilized for discharge and I doubt any other medical condition or other [facility] requiring further screening, evaluation, or treatment in the ED at this time. I have discussed the findings, Dx and Tx plan with the patient/family who expressed understanding and agree(s) with the plan. Discharge instructions discussed at length. The patient/family was given strict return precautions who verbalized  understanding of the instructions. No further questions at time of discharge.  Disposition: Discharge  Condition: Good  ED Discharge Orders          Ordered    loratadine  (CLARITIN ) 10 MG tablet  Daily        01/13/23 0011    fluticasone  (FLONASE ) 50 MCG/ACT nasal spray  Daily        01/13/23 0011             Follow Up: Inc, [facility] [address] [place] [address] [phone]  Call    [facility] Address: [address], [place], [place] [address] Hours: Open 24 hours Monday through Sunday Phone: [phone] Go to  as needed    This chart was dictated using voice recognition software.  Despite best efforts to proofread,  errors can occur which can change the documentation meaning.    Trine Raynell Moder, MD 01/13/23 [phone]

## 2023-01-13 NOTE — ED Triage Notes (Signed)
 Std check due to potential exposure.
